# Patient Record
Sex: Male | Born: 1965 | Race: White | Hispanic: No | Marital: Single | Smoking: Current every day smoker
Health system: Southern US, Community
[De-identification: ages and names within clinical notes are randomized; demographics above are authoritative.]

## PROBLEM LIST (undated history)

## (undated) DIAGNOSIS — S81812A Laceration without foreign body, left lower leg, initial encounter: Secondary | ICD-10-CM

## (undated) DIAGNOSIS — W3400XA Accidental discharge from unspecified firearms or gun, initial encounter: Secondary | ICD-10-CM

## (undated) HISTORY — PX: OTHER SURGICAL HISTORY: SHX169

---

## 2001-12-16 ENCOUNTER — Emergency Department (HOSPITAL_COMMUNITY): Admission: EM | Admit: 2001-12-16 | Discharge: 2001-12-16 | Payer: Self-pay | Admitting: *Deleted

## 2004-01-02 ENCOUNTER — Emergency Department (HOSPITAL_COMMUNITY): Admission: EM | Admit: 2004-01-02 | Discharge: 2004-01-02 | Payer: Self-pay | Admitting: Emergency Medicine

## 2010-01-31 ENCOUNTER — Emergency Department (HOSPITAL_COMMUNITY): Admission: EM | Admit: 2010-01-31 | Discharge: 2010-01-31 | Payer: Self-pay | Admitting: Emergency Medicine

## 2012-09-15 ENCOUNTER — Emergency Department (HOSPITAL_COMMUNITY): Payer: Self-pay

## 2012-09-15 ENCOUNTER — Encounter (HOSPITAL_COMMUNITY): Payer: Self-pay | Admitting: *Deleted

## 2012-09-15 ENCOUNTER — Emergency Department (HOSPITAL_COMMUNITY)
Admission: EM | Admit: 2012-09-15 | Discharge: 2012-09-15 | Disposition: A | Discharge status: Home / Self Care | Payer: Self-pay | Attending: Emergency Medicine | Admitting: Emergency Medicine

## 2012-09-15 DIAGNOSIS — K8020 Calculus of gallbladder without cholecystitis without obstruction: Secondary | ICD-10-CM | POA: Insufficient documentation

## 2012-09-15 DIAGNOSIS — R109 Unspecified abdominal pain: Secondary | ICD-10-CM | POA: Insufficient documentation

## 2012-09-15 DIAGNOSIS — F17200 Nicotine dependence, unspecified, uncomplicated: Secondary | ICD-10-CM | POA: Insufficient documentation

## 2012-09-15 LAB — URINALYSIS, ROUTINE W REFLEX MICROSCOPIC
Leukocytes, UA: NEGATIVE
Protein, ur: NEGATIVE mg/dL
Urobilinogen, UA: 0.2 mg/dL (ref 0.0–1.0)

## 2012-09-15 LAB — BASIC METABOLIC PANEL
CO2: 27 mEq/L (ref 19–32)
Creatinine, Ser: 0.88 mg/dL (ref 0.50–1.35)
GFR calc non Af Amer: >90 mL/min (ref >90)
Potassium: 4.1 mEq/L (ref 3.5–5.1)
Sodium: 135 mEq/L (ref 135–145)

## 2012-09-15 LAB — CBC WITH DIFFERENTIAL/PLATELET
Eosinophils Relative: 2 % (ref 0–5)
HCT: 43.3 % (ref 39.0–52.0)
MCV: 93.1 fL (ref 78.0–100.0)
Monocytes Absolute: 0.9 10*3/uL (ref 0.1–1.0)
Monocytes Relative: 7 % (ref 3–12)
RBC: 4.65 MIL/uL (ref 4.22–5.81)
RDW: 13 % (ref 11.5–15.5)
WBC: 13 10*3/uL — ABNORMAL HIGH (ref 4.0–10.5)

## 2012-09-15 LAB — URINE MICROSCOPIC-ADD ON

## 2012-09-15 MED ORDER — HYDROMORPHONE HCL PF 1 MG/ML IJ SOLN
1.0000 mg | Freq: Once | INTRAMUSCULAR | Status: AC
  Administered 2012-09-15: 1 mg via INTRAVENOUS
  Filled 2012-09-15: qty 1

## 2012-09-15 MED ORDER — ONDANSETRON HCL 4 MG/2ML IJ SOLN
4.0000 mg | Freq: Once | INTRAMUSCULAR | Status: AC
  Administered 2012-09-15: 4 mg via INTRAVENOUS
  Filled 2012-09-15: qty 2

## 2012-09-15 MED ORDER — OXYCODONE-ACETAMINOPHEN 5-325 MG PO TABS: 2.0000 | ORAL_TABLET | ORAL | Status: DC | PRN

## 2012-09-15 MED ORDER — SODIUM CHLORIDE 0.9 % IV BOLUS (SEPSIS)
1000.0000 mL | Freq: Once | INTRAVENOUS | Status: AC
  Administered 2012-09-15: 1000 mL via INTRAVENOUS

## 2012-09-15 NOTE — ED Notes (Addendum)
Pt notes right sided flank pain that radiates to his abdomen. Denies history of kidney stones. Denies n/v/d. States no pain at this time since receiving pain medication. Pt is aware we are waiting on results of his CT scan
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
## 2012-09-15 NOTE — ED Notes (Signed)
Pt c/o rt flank pain with nausea all day today.

## 2012-09-15 NOTE — ED Notes (Signed)
Rt flank pain, nausea, no vomiting

## 2012-10-12 ENCOUNTER — Emergency Department (HOSPITAL_COMMUNITY): Payer: Self-pay

## 2012-10-12 ENCOUNTER — Inpatient Hospital Stay (HOSPITAL_COMMUNITY): Payer: Self-pay

## 2012-10-12 ENCOUNTER — Encounter (HOSPITAL_COMMUNITY): Payer: Self-pay | Admitting: Emergency Medicine

## 2012-10-12 ENCOUNTER — Inpatient Hospital Stay (HOSPITAL_COMMUNITY)
Admission: EM | Admit: 2012-10-12 | Discharge: 2012-10-15 | DRG: 416 | Disposition: A | Discharge status: Home / Self Care | Payer: Self-pay | Attending: General Surgery | Admitting: General Surgery

## 2012-10-12 DIAGNOSIS — K8000 Calculus of gallbladder with acute cholecystitis without obstruction: Principal | ICD-10-CM | POA: Diagnosis present

## 2012-10-12 DIAGNOSIS — F17200 Nicotine dependence, unspecified, uncomplicated: Secondary | ICD-10-CM | POA: Diagnosis present

## 2012-10-12 DIAGNOSIS — Z23 Encounter for immunization: Secondary | ICD-10-CM

## 2012-10-12 DIAGNOSIS — K819 Cholecystitis, unspecified: Secondary | ICD-10-CM

## 2012-10-12 LAB — CBC WITH DIFFERENTIAL/PLATELET
Lymphocytes Relative: 17 % (ref 12–46)
MCH: 32.2 pg (ref 26.0–34.0)
MCHC: 35.1 g/dL (ref 30.0–36.0)
Monocytes Absolute: 0.8 10*3/uL (ref 0.1–1.0)
Monocytes Relative: 7 % (ref 3–12)
Neutrophils Relative %: 75 % (ref 43–77)
Platelets: 248 10*3/uL (ref 150–400)

## 2012-10-12 LAB — SURGICAL PCR SCREEN
MRSA, PCR: NEGATIVE
Staphylococcus aureus: POSITIVE — AB

## 2012-10-12 LAB — URINE MICROSCOPIC-ADD ON

## 2012-10-12 LAB — COMPREHENSIVE METABOLIC PANEL
ALT: 16 U/L (ref 0–53)
AST: 15 U/L (ref 0–37)
Albumin: 3.9 g/dL (ref 3.5–5.2)
CO2: 26 mEq/L (ref 19–32)
Chloride: 103 mEq/L (ref 96–112)
GFR calc Af Amer: >90 mL/min (ref >90)
Sodium: 138 mEq/L (ref 135–145)

## 2012-10-12 LAB — LIPASE, BLOOD: Lipase: 47 U/L (ref 11–59)

## 2012-10-12 LAB — URINALYSIS, ROUTINE W REFLEX MICROSCOPIC
Bilirubin Urine: NEGATIVE
Glucose, UA: NEGATIVE mg/dL
Specific Gravity, Urine: >1.03 — ABNORMAL HIGH (ref 1.005–1.030)

## 2012-10-12 MED ORDER — SODIUM CHLORIDE 0.9 % IV SOLN
1000.0000 mL | Freq: Once | INTRAVENOUS | Status: AC
  Administered 2012-10-12: 1000 mL via INTRAVENOUS

## 2012-10-12 MED ORDER — HYDROMORPHONE HCL PF 1 MG/ML IJ SOLN
1.0000 mg | Freq: Once | INTRAMUSCULAR | Status: AC
  Administered 2012-10-12: 1 mg via INTRAVENOUS
  Filled 2012-10-12: qty 1

## 2012-10-12 MED ORDER — ONDANSETRON HCL 4 MG/2ML IJ SOLN: 4.0000 mg | Freq: Four times a day (QID) | INTRAMUSCULAR | Status: DC | PRN

## 2012-10-12 MED ORDER — INFLUENZA VIRUS VACC SPLIT PF IM SUSP
0.5000 mL | INTRAMUSCULAR | Status: AC
  Administered 2012-10-13: 0.5 mL via INTRAMUSCULAR
  Filled 2012-10-12: qty 0.5

## 2012-10-12 MED ORDER — SODIUM CHLORIDE 0.9 % IV SOLN
3.0000 g | Freq: Once | INTRAVENOUS | Status: AC
  Administered 2012-10-12: 3 g via INTRAVENOUS
  Filled 2012-10-12: qty 3

## 2012-10-12 MED ORDER — HYDROMORPHONE HCL PF 1 MG/ML IJ SOLN
1.0000 mg | INTRAMUSCULAR | Status: DC | PRN
  Administered 2012-10-12: 1 mg via INTRAVENOUS
  Filled 2012-10-12: qty 1

## 2012-10-12 MED ORDER — SODIUM CHLORIDE 0.9 % IV BOLUS (SEPSIS)
1000.0000 mL | Freq: Once | INTRAVENOUS | Status: AC
  Administered 2012-10-12: 1000 mL via INTRAVENOUS

## 2012-10-12 MED ORDER — GADOBENATE DIMEGLUMINE 529 MG/ML IV SOLN
15.0000 mL | Freq: Once | INTRAVENOUS | Status: AC | PRN
  Administered 2012-10-12: 15 mL via INTRAVENOUS

## 2012-10-12 MED ORDER — ENOXAPARIN SODIUM 40 MG/0.4ML ~~LOC~~ SOLN
40.0000 mg | Freq: Once | SUBCUTANEOUS | Status: AC
  Administered 2012-10-12: 40 mg via SUBCUTANEOUS
  Filled 2012-10-12: qty 0.4

## 2012-10-12 MED ORDER — ONDANSETRON HCL 4 MG/2ML IJ SOLN
4.0000 mg | Freq: Once | INTRAMUSCULAR | Status: AC
  Administered 2012-10-12: 4 mg via INTRAVENOUS
  Filled 2012-10-12: qty 2

## 2012-10-12 MED ORDER — CHLORHEXIDINE GLUCONATE CLOTH 2 % EX PADS
6.0000 | MEDICATED_PAD | Freq: Every day | CUTANEOUS | Status: DC
  Administered 2012-10-13 – 2012-10-14 (×2): 6 via TOPICAL

## 2012-10-12 MED ORDER — SODIUM CHLORIDE 0.9 % IV SOLN
INTRAVENOUS | Status: DC
  Administered 2012-10-12 – 2012-10-13 (×3): via INTRAVENOUS

## 2012-10-12 MED ORDER — CHLORHEXIDINE GLUCONATE 4 % EX LIQD
1.0000 "application " | Freq: Once | CUTANEOUS | Status: AC
  Administered 2012-10-12: 1 via TOPICAL
  Filled 2012-10-12: qty 15

## 2012-10-12 MED ORDER — MUPIROCIN 2 % EX OINT
1.0000 "application " | TOPICAL_OINTMENT | Freq: Two times a day (BID) | CUTANEOUS | Status: DC
  Administered 2012-10-12 – 2012-10-13 (×4): 1 via NASAL
  Filled 2012-10-12: qty 22

## 2012-10-12 MED ORDER — SODIUM CHLORIDE 0.9 % IV SOLN
1000.0000 mL | INTRAVENOUS | Status: DC
  Administered 2012-10-12: 1000 mL via INTRAVENOUS

## 2012-10-12 MED ORDER — PNEUMOCOCCAL VAC POLYVALENT 25 MCG/0.5ML IJ INJ
0.5000 mL | INJECTION | INTRAMUSCULAR | Status: AC
  Administered 2012-10-13: 0.5 mL via INTRAMUSCULAR
  Filled 2012-10-12: qty 0.5
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
## 2012-10-12 NOTE — ED Notes (Signed)
Patient would like something for a headache RN made aware. 
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
## 2012-10-12 NOTE — ED Notes (Signed)
Pt voiced understanding of being npo.
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
## 2012-10-12 NOTE — ED Notes (Signed)
Pt completely undressed except for hospital gown. Belongings were placed in belonging bags by the pt.

## 2012-10-12 NOTE — ED Notes (Signed)
States he has been having intermittent chest discomfort along with his right abdominal pain, states his chest discomfort has returned.

## 2012-10-12 NOTE — ED Notes (Signed)
Patient states that he feels like there is something wrong with him and worries about it frequently, asks questions regarding cancer of the stomach.

## 2012-10-12 NOTE — ED Notes (Signed)
Pt washed off with CHG wipes

## 2012-10-12 NOTE — ED Notes (Signed)
States he has been having abdominal pain off and on for about 1 year, states about 1 month ago he was diagnosed with gallstones and was to follow up with Dr. Lovell Sheehan in November and has not done so.

## 2012-10-13 MED ORDER — SODIUM CHLORIDE 0.9 % IV SOLN
1.5000 g | Freq: Four times a day (QID) | INTRAVENOUS | Status: DC
  Administered 2012-10-13 – 2012-10-14 (×4): 1.5 g via INTRAVENOUS
  Filled 2012-10-13 (×5): qty 1.5

## 2012-10-13 MED ORDER — CHLORHEXIDINE GLUCONATE 4 % EX LIQD
1.0000 "application " | Freq: Once | CUTANEOUS | Status: AC
  Administered 2012-10-13: 1 via TOPICAL
  Filled 2012-10-13: qty 15

## 2012-10-13 MED ORDER — SODIUM CHLORIDE 0.9 % IV SOLN
INTRAVENOUS | Status: DC
  Administered 2012-10-13: 20:00:00 via INTRAVENOUS

## 2012-10-13 NOTE — Care Management Note (Unsigned)
    Page 1 of 1   10/13/2012     1:35:19 PM   CARE MANAGEMENT NOTE 10/13/2012  Patient:  Jeffery Potter, Jeffery Potter   Account Number:  [id number]  Date Initiated:  10/13/2012  Documentation initiated by:  Sharrie Rothman  Subjective/Objective Assessment:   Pt admitted form home with acute cholecystitis. Pt lives with his brother and is indpendent.     Action/Plan:   Financial counselor awareof pts self pay status. No CM or HH needs noted.   Anticipated DC Date:  10/15/2012   Anticipated DC Plan:  HOME/SELF CARE  In-house referral  Financial Counselor      DC Planning Services  CM consult      Choice offered to / List presented to:             Status of service:  Completed, signed off Medicare Important Message given?   (If response is "NO", the following Medicare IM given date fields will be blank) Date Medicare IM given:   Date Additional Medicare IM given:    Discharge Disposition:    Per UR Regulation:    If discussed at Long Length of Stay Meetings, dates discussed:    Comments:  10/13/12 1330 Arlyss Queen, RN BSN CM

## 2012-10-13 NOTE — Progress Notes (Signed)
UR chart review completed.  

## 2012-10-13 NOTE — Progress Notes (Signed)
Subjective: Still has intermittent right upper quadrant abdominal pain, but much improved. He is tolerating full liquid diet well.  Objective: Vital signs in last 24 hours: Temp:  [97.4 F (36.3 C)-98.7 F (37.1 C)] 97.8 F (36.6 C) (12/05 0404) Pulse Rate:  [54-68] 61  (12/05 0404) Resp:  [16-18] 18  (12/05 0404) BP: (91-121)/(44-68) 106/57 mmHg (12/05 0621) SpO2:  [97 %-100 %] 98 % (12/05 0404) Weight:  [74.9 kg (165 lb 2 oz)] 74.9 kg (165 lb 2 oz) (12/04 1103) Last BM Date: 10/10/12  Intake/Output from previous day: 12/04 0701 - 12/05 0700 In: 2410 [P.O.:480; I.V.:1930] Out: 250 [Urine:250] Intake/Output this shift:    General appearance: alert, cooperative and no distress Resp: clear to auscultation bilaterally Cardio: regular rate and rhythm, S1, S2 normal, no murmur, click, rub or gallop GI: Soft, flat. Slightly tender in the right upper quadrant to palpation.  Lab Results:   [facility] 10/12/12 0259  WBC 10.7*  HGB 14.1  HCT 40.2  PLT 248   BMET  Basename 10/12/12 0259  NA 138  K 3.9  CL 103  CO2 26  GLUCOSE 105*  BUN 21  CREATININE 1.10  CALCIUM 9.3   PT/INR No results found for this basename: LABPROT:2,INR:2 in the last 72 hours  Studies/Results: Mr Abd W/wo Cm/mrcp  10/13/2012  *RADIOLOGY REPORT*  Clinical Data:  Right upper quadrant abdominal pain with nausea and vomiting.  Cholelithiasis and biliary dilatation on CT/ultrasound.  MRI ABDOMEN WITHOUT AND WITH CONTRAST (INCLUDING MRCP)  Technique:  Multiplanar multisequence MR imaging of the abdomen was performed both before and after the administration of intravenous contrast. Heavily T2-weighted images of the biliary and pancreatic ducts were obtained, and three-dimensional MRCP images were rendered by post processing.  Contrast: 15mL MULTIHANCE GADOBENATE DIMEGLUMINE 529 MG/ML IV SOLN  Comparison:  Abdominal CT 09/15/2012.  Ultrasound 10/12/2012.  Findings:  Study is mildly motion degraded,  especially on the T2 and postcontrast images.  As demonstrated on earlier ultrasound, there are multiple gallstones.  There is moderate gallbladder wall thickening with pericholecystic fluid. The common bile duct measures 9 mm in diameter.  There is no intrahepatic biliary dilatation.  There is no evidence of choledocholithiasis.  The pancreas appears normal.  There is no evidence of peripancreatic fluid collection or pancreas divisum.  No significant hepatic abnormalities are identified.  Mild heterogeneity on the arterial phase images is attributed to incidental vascular phenomena.  The spleen, adrenal glands and kidneys appear normal.  The abdominal bowel gas pattern appears normal.  IMPRESSION:  1.  Cholelithiasis with gallbladder wall thickening and pericholecystic fluid consistent with acute cholecystitis as demonstrated on earlier ultrasound. 2.  Mild biliary dilatation without evidence of choledocholithiasis. 3.  No evidence of pancreatitis or pancreas divisum.   Original Report Authenticated By: Carey Bullocks, M.D.    US Abdomen Limited Ruq  10/12/2012  *RADIOLOGY REPORT*  Clinical Data:  Gallstones.  Evaluate for cholecystitis.  LIMITED ABDOMINAL ULTRASOUND - RIGHT UPPER QUADRANT  Comparison:  CT abdomen pelvis 09/15/2012  Findings:  Gallbladder:  Multiple gallstones are present, with the largest gallstone measuring approximately 1.8 cm in diameter. A gallstone is noted at the level of the gallbladder neck, and the sonographer noted that it did not move with changes in patient positioning. The gallbladder wall is thickened and demonstrates hypoechoic bands of edema within it.  Wall thickness ranges in thickness from are approximately 7 mm to 12 mm. The sonographic Murphy's sign is positive.  Common bile duct:  Dilated, measuring up to 9.3 mm where visualized. No filling defects are seen within the visualized portion of the common bile duct.  Liver:  Normal in echogenicity.  No focal hepatic lesion.  No  definite intrahepatic biliary duct dilatation.  IMPRESSION:  1.  Acute cholecystitis. 2.  Common bile duct dilatation.  Measures up to 9.3 mm where visualized.  A distal common bile duct obstruction cannot be excluded; the duct is not seen in its entirety.                    Original Report Authenticated By: Britta Mccreedy, M.D.     Anti-infectives: Anti-infectives     Start     Dose/Rate Route Frequency Ordered Stop   10/12/12 0815   Ampicillin-Sulbactam (UNASYN) 3 g in sodium chloride 0.9 % 100 mL IVPB        3 g 100 mL/hr over 60 Minutes Intravenous  Once 10/12/12 0800 10/12/12 4098          Assessment/Plan: Impression: Acute cholecystitis with cholelithiasis. MRCP done yesterday evening reveals no choledocholithiasis. We'll continue IV Unasyn. Patient to undergo open cholecystectomy tomorrow. The risks and benefits of the procedure including bleeding, infection, and hepatobiliary injury were fully explained to the patient, who gave informed consent.  LOS: 1 day    Jeffery Potter A 10/13/2012

## 2012-10-14 ENCOUNTER — Encounter (HOSPITAL_COMMUNITY): Payer: Self-pay | Admitting: Anesthesiology

## 2012-10-14 ENCOUNTER — Encounter (HOSPITAL_COMMUNITY)
Admission: EM | Disposition: A | Discharge status: Home / Self Care | Payer: Self-pay | Source: Home / Self Care | Attending: General Surgery

## 2012-10-14 ENCOUNTER — Inpatient Hospital Stay (HOSPITAL_COMMUNITY): Payer: Self-pay | Admitting: Anesthesiology

## 2012-10-14 ENCOUNTER — Encounter (HOSPITAL_COMMUNITY): Payer: Self-pay | Admitting: *Deleted

## 2012-10-14 HISTORY — PX: CHOLECYSTECTOMY: SHX55

## 2012-10-14 SURGERY — CHOLECYSTECTOMY
Anesthesia: General | Site: Abdomen | Wound class: Contaminated

## 2012-10-14 MED ORDER — ENOXAPARIN SODIUM 40 MG/0.4ML ~~LOC~~ SOLN
40.0000 mg | SUBCUTANEOUS | Status: DC
  Filled 2012-10-14: qty 0.4

## 2012-10-14 MED ORDER — BUPIVACAINE HCL (PF) 0.5 % IJ SOLN
INTRAMUSCULAR | Status: DC | PRN
  Administered 2012-10-14: 10 mL

## 2012-10-14 MED ORDER — ONDANSETRON HCL 4 MG PO TABS
4.0000 mg | ORAL_TABLET | Freq: Four times a day (QID) | ORAL | Status: DC | PRN
  Filled 2012-10-14: qty 1

## 2012-10-14 MED ORDER — MIDAZOLAM HCL 2 MG/2ML IJ SOLN
INTRAMUSCULAR | Status: AC
  Filled 2012-10-14: qty 2

## 2012-10-14 MED ORDER — ACETAMINOPHEN 10 MG/ML IV SOLN
INTRAVENOUS | Status: AC
  Filled 2012-10-14: qty 100

## 2012-10-14 MED ORDER — HEMOSTATIC AGENTS (NO CHARGE) OPTIME
TOPICAL | Status: DC | PRN
  Administered 2012-10-14: 1 via TOPICAL

## 2012-10-14 MED ORDER — MIDAZOLAM HCL 2 MG/2ML IJ SOLN
1.0000 mg | INTRAMUSCULAR | Status: DC | PRN
  Administered 2012-10-14 (×2): 2 mg via INTRAVENOUS

## 2012-10-14 MED ORDER — FENTANYL CITRATE 0.05 MG/ML IJ SOLN
INTRAMUSCULAR | Status: AC
  Filled 2012-10-14: qty 5

## 2012-10-14 MED ORDER — PROPOFOL 10 MG/ML IV BOLUS
INTRAVENOUS | Status: DC | PRN
  Administered 2012-10-14: 170 mg via INTRAVENOUS

## 2012-10-14 MED ORDER — ONDANSETRON HCL 4 MG/2ML IJ SOLN: 4.0000 mg | Freq: Four times a day (QID) | INTRAMUSCULAR | Status: DC | PRN

## 2012-10-14 MED ORDER — GLYCOPYRROLATE 0.2 MG/ML IJ SOLN
INTRAMUSCULAR | Status: AC
  Filled 2012-10-14: qty 4

## 2012-10-14 MED ORDER — ONDANSETRON HCL 4 MG/2ML IJ SOLN
4.0000 mg | Freq: Once | INTRAMUSCULAR | Status: AC
  Administered 2012-10-14: 4 mg via INTRAVENOUS

## 2012-10-14 MED ORDER — SUCCINYLCHOLINE CHLORIDE 20 MG/ML IJ SOLN
INTRAMUSCULAR | Status: AC
  Filled 2012-10-14: qty 1

## 2012-10-14 MED ORDER — LACTATED RINGERS IV SOLN
INTRAVENOUS | Status: DC
  Administered 2012-10-14: 09:00:00 via INTRAVENOUS
  Administered 2012-10-14: 1000 mL via INTRAVENOUS

## 2012-10-14 MED ORDER — POVIDONE-IODINE 10 % EX OINT
TOPICAL_OINTMENT | CUTANEOUS | Status: AC
  Filled 2012-10-14: qty 2

## 2012-10-14 MED ORDER — GLYCOPYRROLATE 0.2 MG/ML IJ SOLN
INTRAMUSCULAR | Status: AC
  Filled 2012-10-14: qty 3

## 2012-10-14 MED ORDER — HYDROMORPHONE HCL PF 1 MG/ML IJ SOLN
1.0000 mg | INTRAMUSCULAR | Status: DC | PRN
  Administered 2012-10-14 (×2): 1 mg via INTRAVENOUS
  Filled 2012-10-14 (×2): qty 1

## 2012-10-14 MED ORDER — ONDANSETRON HCL 4 MG/2ML IJ SOLN: 4.0000 mg | Freq: Once | INTRAMUSCULAR | Status: DC | PRN

## 2012-10-14 MED ORDER — LACTATED RINGERS IV SOLN
INTRAVENOUS | Status: DC
  Administered 2012-10-14 – 2012-10-15 (×2): via INTRAVENOUS

## 2012-10-14 MED ORDER — HYDROMORPHONE HCL PF 1 MG/ML IJ SOLN
INTRAMUSCULAR | Status: AC
  Filled 2012-10-14: qty 1

## 2012-10-14 MED ORDER — GLYCOPYRROLATE 0.2 MG/ML IJ SOLN
INTRAMUSCULAR | Status: DC | PRN
  Administered 2012-10-14: .8 mg via INTRAVENOUS

## 2012-10-14 MED ORDER — PROPOFOL 10 MG/ML IV EMUL
INTRAVENOUS | Status: AC
  Filled 2012-10-14: qty 20

## 2012-10-14 MED ORDER — NEOSTIGMINE METHYLSULFATE 1 MG/ML IJ SOLN
INTRAMUSCULAR | Status: AC
  Filled 2012-10-14: qty 10

## 2012-10-14 MED ORDER — FENTANYL CITRATE 0.05 MG/ML IJ SOLN
INTRAMUSCULAR | Status: DC | PRN
  Administered 2012-10-14 (×11): 50 ug via INTRAVENOUS

## 2012-10-14 MED ORDER — ROCURONIUM BROMIDE 100 MG/10ML IV SOLN
INTRAVENOUS | Status: DC | PRN
  Administered 2012-10-14: 10 mg via INTRAVENOUS
  Administered 2012-10-14: 40 mg via INTRAVENOUS

## 2012-10-14 MED ORDER — LIDOCAINE HCL (CARDIAC) 20 MG/ML IV SOLN
INTRAVENOUS | Status: DC | PRN
  Administered 2012-10-14: 50 mg via INTRAVENOUS

## 2012-10-14 MED ORDER — ACETAMINOPHEN 10 MG/ML IV SOLN
1000.0000 mg | Freq: Four times a day (QID) | INTRAVENOUS | Status: AC
  Administered 2012-10-14 – 2012-10-15 (×4): 1000 mg via INTRAVENOUS
  Filled 2012-10-14 (×3): qty 100

## 2012-10-14 MED ORDER — HYDROMORPHONE HCL PF 1 MG/ML IJ SOLN
0.2500 mg | INTRAMUSCULAR | Status: DC | PRN
  Administered 2012-10-14 (×4): 0.5 mg via INTRAVENOUS

## 2012-10-14 MED ORDER — FENTANYL CITRATE 0.05 MG/ML IJ SOLN
25.0000 ug | INTRAMUSCULAR | Status: DC | PRN
  Administered 2012-10-14 (×4): 50 ug via INTRAVENOUS

## 2012-10-14 MED ORDER — ONDANSETRON HCL 4 MG/2ML IJ SOLN
INTRAMUSCULAR | Status: AC
  Filled 2012-10-14: qty 2

## 2012-10-14 MED ORDER — ROCURONIUM BROMIDE 50 MG/5ML IV SOLN
INTRAVENOUS | Status: AC
  Filled 2012-10-14: qty 1

## 2012-10-14 MED ORDER — 0.9 % SODIUM CHLORIDE (POUR BTL) OPTIME
TOPICAL | Status: DC | PRN
  Administered 2012-10-14: 2000 mL

## 2012-10-14 MED ORDER — BUPIVACAINE HCL (PF) 0.5 % IJ SOLN
INTRAMUSCULAR | Status: AC
  Filled 2012-10-14: qty 30

## 2012-10-14 MED ORDER — POVIDONE-IODINE 10 % OINT PACKET
TOPICAL_OINTMENT | CUTANEOUS | Status: DC | PRN
  Administered 2012-10-14: 1 via TOPICAL

## 2012-10-14 MED ORDER — NEOSTIGMINE METHYLSULFATE 1 MG/ML IJ SOLN
INTRAMUSCULAR | Status: DC | PRN
  Administered 2012-10-14: 4 mg via INTRAVENOUS

## 2012-10-14 MED ORDER — LIDOCAINE HCL (PF) 1 % IJ SOLN
INTRAMUSCULAR | Status: AC
  Filled 2012-10-14: qty 5

## 2012-10-14 SURGICAL SUPPLY — 38 items
APPLIER CLIP 11 MED OPEN (CLIP) ×3
APPLIER CLIP 13 LRG OPEN (CLIP) ×3
BAG HAMPER (MISCELLANEOUS) ×3 IMPLANT
CLEANER TIP ELECTROSURG 2X2 (MISCELLANEOUS) ×3 IMPLANT
CLIP APPLIE 11 MED OPEN (CLIP) ×2 IMPLANT
CLIP APPLIE 13 LRG OPEN (CLIP) ×2 IMPLANT
CLOTH BEACON ORANGE TIMEOUT ST (SAFETY) ×3 IMPLANT
COVER LIGHT HANDLE STERIS (MISCELLANEOUS) ×3 IMPLANT
DRAPE WARM FLUID 44X44 (DRAPE) ×3 IMPLANT
DURAPREP 26ML APPLICATOR (WOUND CARE) ×3 IMPLANT
ELECT BLADE 6 FLAT ULTRCLN (ELECTRODE) ×3 IMPLANT
ELECT REM PT RETURN 9FT ADLT (ELECTROSURGICAL) ×3
ELECTRODE REM PT RTRN 9FT ADLT (ELECTROSURGICAL) ×2 IMPLANT
FORMALIN 10 PREFIL 480ML (MISCELLANEOUS) ×3 IMPLANT
GLOVE BIO SURGEON STRL SZ7.5 (GLOVE) ×3 IMPLANT
GLOVE INDICATOR 7.0 STRL GRN (GLOVE) ×12 IMPLANT
GLOVE SS BIOGEL STRL SZ 6.5 (GLOVE) ×8 IMPLANT
GLOVE SUPERSENSE BIOGEL SZ 6.5 (GLOVE) ×4
GOWN STRL REIN XL XLG (GOWN DISPOSABLE) ×12 IMPLANT
HEMOSTAT SNOW SURGICEL 2X4 (HEMOSTASIS) ×3 IMPLANT
INST SET MAJOR GENERAL (KITS) ×3 IMPLANT
KIT ROOM TURNOVER APOR (KITS) ×3 IMPLANT
MANIFOLD NEPTUNE II (INSTRUMENTS) ×3 IMPLANT
NEEDLE HYPO 25X1 1.5 SAFETY (NEEDLE) ×3 IMPLANT
NS IRRIG 1000ML POUR BTL (IV SOLUTION) ×6 IMPLANT
PACK ABDOMINAL MAJOR (CUSTOM PROCEDURE TRAY) ×3 IMPLANT
PAD ARMBOARD 7.5X6 YLW CONV (MISCELLANEOUS) ×3 IMPLANT
SET BASIN LINEN APH (SET/KITS/TRAYS/PACK) ×3 IMPLANT
SPONGE GAUZE 4X4 12PLY (GAUZE/BANDAGES/DRESSINGS) ×3 IMPLANT
SPONGE INTESTINAL PEANUT (DISPOSABLE) ×6 IMPLANT
SPONGE LAP 4X18 X RAY DECT (DISPOSABLE) ×3 IMPLANT
STAPLER VISISTAT (STAPLE) ×6 IMPLANT
SUT SILK 2 0 (SUTURE) ×1
SUT SILK 2-0 18XBRD TIE 12 (SUTURE) ×2 IMPLANT
SUT VIC AB 0 CT1 27 (SUTURE) ×2
SUT VIC AB 0 CT1 27XCR 8 STRN (SUTURE) ×4 IMPLANT
SYR CONTROL 10ML LL (SYRINGE) ×3 IMPLANT
TAPE CLOTH SURG 4X10 WHT LF (GAUZE/BANDAGES/DRESSINGS) ×3 IMPLANT

## 2012-10-14 NOTE — Progress Notes (Signed)
Lap top, gray book light at bedside. Eyeglasses returned to pt.

## 2012-10-14 NOTE — Anesthesia Postprocedure Evaluation (Addendum)
  Anesthesia Post-op Note  Patient: Jeffery Potter  Procedure(s) Performed: Procedure(s) (LRB) with comments: CHOLECYSTECTOMY (N/A)  Patient Location: PACU  Anesthesia Type:General  Level of Consciousness: awake, alert  and oriented  Airway and Oxygen Therapy: Patient Spontanous Breathing and Patient connected to face mask oxygen  Post-op Pain: moderate  Post-op Assessment: Post-op Vital signs reviewed, Patient's Cardiovascular Status Stable, Respiratory Function Stable, Patent Airway and No signs of Nausea or vomiting  Post-op Vital Signs: Reviewed and stable  Complications: No apparent anesthesia complications 10/16/12  Patient discharged yesterday.

## 2012-10-14 NOTE — Anesthesia Procedure Notes (Signed)
Procedure Name: Intubation Date/Time: 10/14/2012 7:42 AM Performed by: Glynn Octave E Pre-anesthesia Checklist: Patient identified, Patient being monitored, Timeout performed, Emergency Drugs available and Suction available Patient Re-evaluated:Patient Re-evaluated prior to inductionOxygen Delivery Method: Circle System Utilized Preoxygenation: Pre-oxygenation with 100% oxygen Intubation Type: IV induction Ventilation: Mask ventilation without difficulty Laryngoscope Size: Mac and 3 Grade View: Grade I Tube type: Oral Tube size: 8.0 mm Number of attempts: 1 Airway Equipment and Method: stylet Placement Confirmation: ETT inserted through vocal cords under direct vision,  positive ETCO2 and breath sounds checked- equal and bilateral Secured at: 22 cm Tube secured with: Tape Dental Injury: Teeth and Oropharynx as per pre-operative assessment

## 2012-10-14 NOTE — Op Note (Signed)
Patient:  Jeffery Potter  DOB:  1966-09-18  MRN:  [id number]   Preop Diagnosis:  Acute cholecystitis, cholelithiasis  Postop Diagnosis:  Same  Procedure:  Open cholecystectomy  Surgeon:  Franky Macho, M.D.  Anes:  General endotracheal  Indications:  Patient is a 46 year old white male who presents with acute cholecystitis secondary to cholelithiasis. Preoperative MRCP was negative for choledocholithiasis. Due to multiple surgeries in the past, the patient now presents for an open cholecystectomy. Risks and benefits of the procedure including bleeding, infection, and the possibility of hepatobiliary injury were fully explained to the patient, who gave informed consent.  Procedure note:  The patient was placed in the supine position. After induction of general endotracheal anesthesia, the abdomen was prepped and draped using usual sterile technique with DuraPrep. Surgical site confirmation was performed.  A right upper quadrant subcostal incision was made down to the peritoneum. The peritoneal cavity was then into without difficulty. There was some adhesion of the transverse colon present and this was freed away sharply without difficulty. The gallbladder was identified. The liver was within normal limits. A retrograde dome down approach was then used to free the gallbladder from the liver using blunt dissection and cautery. The cystic duct and artery were then fully identified. There junction to the infundibulum flow identified. Clips were placed proximally distally on both structures, then divided. The gallbladder was removed from the operative field and sent to pathology further examination. A small amount of bile leakage was noted from the gallbladder. The right upper quadrant was copiously are irrigated with normal saline. The gallbladder bed was within normal limits. No bile leakage or bleeding was noted. Surgicel was placed in the gallbladder fossa. All fluid intakes were then removed from  the abdominal cavity.  The posterior rectus sheath was reapproximated using interrupted 0 Vicryl sutures. The anterior rectus sheath was reapproximated using 0 Vicryl interrupted sutures. The skin was injected with 0.5% Sensorcaine. The subcutaneous layer was irrigated normal saline and the skin was closed using staples.  All tape and needle counts were correct at the end of the procedure. The patient was extubated in the operating room and transferred to PACU in stable condition.  Complications:  None  EBL:  25 cc  Specimen:  Gallbladder

## 2012-10-14 NOTE — Anesthesia Preprocedure Evaluation (Addendum)
Anesthesia Evaluation  Patient identified by MRN, date of birth, ID band Patient awake    Reviewed: Allergy & Precautions, H&P , NPO status , Patient's Chart, lab work & pertinent test results  History of Anesthesia Complications Negative for: history of anesthetic complications  Airway Mallampati: I TM Distance: >3 FB     Dental  (+) Teeth Intact   Pulmonary neg pulmonary ROS, Current Smoker,  breath sounds clear to auscultation        Cardiovascular negative cardio ROS  Rhythm:Regular Rate:Normal     Neuro/Psych    GI/Hepatic negative GI ROS,   Endo/Other    Renal/GU      Musculoskeletal   Abdominal   Peds  Hematology   Anesthesia Other Findings   Reproductive/Obstetrics                           Anesthesia Physical Anesthesia Plan  ASA: II  Anesthesia Plan: General   Post-op Pain Management:    Induction: Intravenous  Airway Management Planned: Oral ETT  Additional Equipment:   Intra-op Plan:   Post-operative Plan: Extubation in OR  Informed Consent: I have reviewed the patients History and Physical, chart, labs and discussed the procedure including the risks, benefits and alternatives for the proposed anesthesia with the patient or authorized representative who has indicated his/her understanding and acceptance.     Plan Discussed with:   Anesthesia Plan Comments:         Anesthesia Quick Evaluation

## 2012-10-14 NOTE — Transfer of Care (Signed)
Immediate Anesthesia Transfer of Care Note  Patient: Jeffery Potter  Procedure(s) Performed: Procedure(s) (LRB) with comments: CHOLECYSTECTOMY (N/A)  Patient Location: PACU  Anesthesia Type:General  Level of Consciousness: awake and alert   Airway & Oxygen Therapy: Patient Spontanous Breathing and Patient connected to face mask oxygen  Post-op Assessment: Report given to PACU RN  Post vital signs: Reviewed and stable  Complications: No apparent anesthesia complications

## 2012-10-15 LAB — CBC
MCH: 31.7 pg (ref 26.0–34.0)
MCHC: 34.3 g/dL (ref 30.0–36.0)
Platelets: 221 10*3/uL (ref 150–400)
RDW: 12.5 % (ref 11.5–15.5)

## 2012-10-15 LAB — COMPREHENSIVE METABOLIC PANEL
ALT: 29 U/L (ref 0–53)
AST: 32 U/L (ref 0–37)
Albumin: 3.7 g/dL (ref 3.5–5.2)
Calcium: 9.1 mg/dL (ref 8.4–10.5)
GFR calc Af Amer: >90 mL/min (ref >90)
Sodium: 137 mEq/L (ref 135–145)
Total Protein: 6.3 g/dL (ref 6.0–8.3)

## 2012-10-15 LAB — MAGNESIUM: Magnesium: 1.8 mg/dL (ref 1.5–2.5)

## 2012-10-15 MED ORDER — OXYCODONE-ACETAMINOPHEN 5-325 MG PO TABS: 1.0000 | ORAL_TABLET | ORAL | Status: DC | PRN

## 2012-10-15 NOTE — Progress Notes (Signed)
Pt provided with discharge and follow-up instructions.  Pt provided with instructions on medication regimen at discharge.  Pt's IV removed.  Pt tolerated well.  Pt stable at time of discharge.

## 2012-10-15 NOTE — Discharge Summary (Signed)
Physician Discharge Summary  Patient ID: Jeffery Potter MRN: [id number] DOB/AGE: [birth year] 46 y.o.  Admit date: 10/12/2012 Discharge date: 10/15/2012  Admission Diagnoses: Acute cholecystitis, cholelithiasis  Discharge Diagnoses: Same Active Problems:  * No active hospital problems. *    Discharged Condition: good  Hospital Course: Patient is a 46 year old white male who presented to the emergency room with worsening right upper quadrant abdominal pain. Given known history of cholelithiasis and this was confirmed by ultrasound. He was also noted to have a dilated common bile duct at 9 mm. Preoperative MRCP was negative for choledocholithiasis. The patient subsequently underwent an open cholecystectomy on 10/14/2012. This was done due to multiple abdominal surgeries in the past. He tolerated the procedure well. His postoperative course has been unremarkable. His diet was advanced at difficulty. The patient is being discharged home in good improving condition.  Treatments: surgery: Open cholecystectomy on 10/14/2012  Discharge Exam: Blood pressure 112/73, pulse 59, temperature 98.6 F (37 C), temperature source Oral, resp. rate 20, height 5\' 10"  (1.778 m), weight 74.9 kg (165 lb 2 oz), SpO2 97.00%. General appearance: alert, cooperative and no distress Resp: clear to auscultation bilaterally Cardio: regular rate and rhythm, S1, S2 normal, no murmur, click, rub or gallop GI: Soft, flat. Incision healing well.  Disposition: 01-Home or Self Care     Medication List     As of 10/15/2012  8:57 AM    TAKE these medications         oxyCODONE-acetaminophen 5-325 MG per tablet   Commonly known as: PERCOCET/ROXICET   Take 1-2 tablets by mouth every 4 (four) hours as needed for pain.           Follow-up Information    Follow up with Dalia Heading, MD. Schedule an appointment as soon as possible for a visit on 10/25/2012.   Contact information:   [address] [place]  [place] [address] [phone]          Signed: Franky Macho A 10/15/2012, 8:57 AM

## 2012-10-16 NOTE — Addendum Note (Signed)
Addendum  created 10/16/12 1317 by Moshe Salisbury, CRNA   Modules edited:Notes Section

## 2012-10-18 ENCOUNTER — Encounter (HOSPITAL_COMMUNITY): Payer: Self-pay | Admitting: General Surgery
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
## 2013-12-03 ENCOUNTER — Encounter (HOSPITAL_COMMUNITY)
Admission: EM | Disposition: A | Discharge status: Home / Self Care | Payer: Self-pay | Source: Home / Self Care | Attending: Emergency Medicine

## 2013-12-03 ENCOUNTER — Encounter (HOSPITAL_COMMUNITY): Payer: Self-pay | Admitting: Emergency Medicine

## 2013-12-03 ENCOUNTER — Emergency Department (HOSPITAL_COMMUNITY): Payer: Self-pay

## 2013-12-03 ENCOUNTER — Observation Stay (HOSPITAL_COMMUNITY)
Admission: EM | Admit: 2013-12-03 | Discharge: 2013-12-04 | Disposition: A | Discharge status: Home / Self Care | Payer: Self-pay | Attending: Orthopedic Surgery | Admitting: Orthopedic Surgery

## 2013-12-03 ENCOUNTER — Encounter (HOSPITAL_COMMUNITY): Payer: Self-pay | Admitting: Anesthesiology

## 2013-12-03 ENCOUNTER — Emergency Department (HOSPITAL_COMMUNITY): Payer: Self-pay | Admitting: Anesthesiology

## 2013-12-03 DIAGNOSIS — F17200 Nicotine dependence, unspecified, uncomplicated: Secondary | ICD-10-CM | POA: Insufficient documentation

## 2013-12-03 DIAGNOSIS — H7292 Unspecified perforation of tympanic membrane, left ear: Secondary | ICD-10-CM

## 2013-12-03 DIAGNOSIS — S92919B Unspecified fracture of unspecified toe(s), initial encounter for open fracture: Secondary | ICD-10-CM | POA: Insufficient documentation

## 2013-12-03 DIAGNOSIS — S98139A Complete traumatic amputation of one unspecified lesser toe, initial encounter: Principal | ICD-10-CM | POA: Insufficient documentation

## 2013-12-03 DIAGNOSIS — W38XXXA Explosion and rupture of other specified pressurized devices, initial encounter: Secondary | ICD-10-CM | POA: Insufficient documentation

## 2013-12-03 DIAGNOSIS — S60511A Abrasion of right hand, initial encounter: Secondary | ICD-10-CM

## 2013-12-03 DIAGNOSIS — Z23 Encounter for immunization: Secondary | ICD-10-CM | POA: Insufficient documentation

## 2013-12-03 DIAGNOSIS — S81009A Unspecified open wound, unspecified knee, initial encounter: Secondary | ICD-10-CM | POA: Insufficient documentation

## 2013-12-03 DIAGNOSIS — S0081XA Abrasion of other part of head, initial encounter: Secondary | ICD-10-CM

## 2013-12-03 DIAGNOSIS — S0920XA Traumatic rupture of unspecified ear drum, initial encounter: Secondary | ICD-10-CM | POA: Insufficient documentation

## 2013-12-03 DIAGNOSIS — IMO0002 Reserved for concepts with insufficient information to code with codable children: Secondary | ICD-10-CM | POA: Insufficient documentation

## 2013-12-03 DIAGNOSIS — IMO0001 Reserved for inherently not codable concepts without codable children: Secondary | ICD-10-CM | POA: Diagnosis present

## 2013-12-03 DIAGNOSIS — S81812A Laceration without foreign body, left lower leg, initial encounter: Secondary | ICD-10-CM

## 2013-12-03 DIAGNOSIS — S61209A Unspecified open wound of unspecified finger without damage to nail, initial encounter: Secondary | ICD-10-CM | POA: Insufficient documentation

## 2013-12-03 DIAGNOSIS — S81809A Unspecified open wound, unspecified lower leg, initial encounter: Secondary | ICD-10-CM

## 2013-12-03 DIAGNOSIS — Y92009 Unspecified place in unspecified non-institutional (private) residence as the place of occurrence of the external cause: Secondary | ICD-10-CM | POA: Insufficient documentation

## 2013-12-03 DIAGNOSIS — S91009A Unspecified open wound, unspecified ankle, initial encounter: Secondary | ICD-10-CM

## 2013-12-03 DIAGNOSIS — S98142A Partial traumatic amputation of one left lesser toe, initial encounter: Secondary | ICD-10-CM

## 2013-12-03 HISTORY — PX: AMPUTATION: SHX166

## 2013-12-03 HISTORY — DX: Laceration without foreign body, left lower leg, initial encounter: S81.812A

## 2013-12-03 HISTORY — PX: I&D EXTREMITY: SHX5045

## 2013-12-03 HISTORY — PX: TOE AMPUTATION: SHX809

## 2013-12-03 SURGERY — IRRIGATION AND DEBRIDEMENT EXTREMITY
Anesthesia: General | Site: Toe | Laterality: Left

## 2013-12-03 MED ORDER — ONDANSETRON HCL 4 MG PO TABS: 4.0000 mg | ORAL_TABLET | Freq: Four times a day (QID) | ORAL | Status: DC | PRN

## 2013-12-03 MED ORDER — METOCLOPRAMIDE HCL 10 MG PO TABS: 5.0000 mg | ORAL_TABLET | Freq: Three times a day (TID) | ORAL | Status: DC | PRN

## 2013-12-03 MED ORDER — CEPHALEXIN 500 MG PO CAPS: 500.0000 mg | ORAL_CAPSULE | Freq: Four times a day (QID) | ORAL | Status: DC

## 2013-12-03 MED ORDER — METHOCARBAMOL 500 MG PO TABS: 500.0000 mg | ORAL_TABLET | Freq: Four times a day (QID) | ORAL | Status: DC | PRN

## 2013-12-03 MED ORDER — OXYCODONE HCL 5 MG PO TABS
5.0000 mg | ORAL_TABLET | Freq: Once | ORAL | Status: AC | PRN
  Administered 2013-12-03: 5 mg via ORAL

## 2013-12-03 MED ORDER — SODIUM CHLORIDE 0.9 % IR SOLN
Status: DC | PRN
  Administered 2013-12-03: 9000 mL

## 2013-12-03 MED ORDER — MIDAZOLAM HCL 5 MG/5ML IJ SOLN
INTRAMUSCULAR | Status: DC | PRN
  Administered 2013-12-03 (×2): 1 mg via INTRAVENOUS

## 2013-12-03 MED ORDER — LACTATED RINGERS IV SOLN
INTRAVENOUS | Status: DC | PRN
  Administered 2013-12-03 (×2): via INTRAVENOUS

## 2013-12-03 MED ORDER — CEFAZOLIN SODIUM 1-5 GM-% IV SOLN
1.0000 g | Freq: Once | INTRAVENOUS | Status: AC
  Administered 2013-12-03: 1 g via INTRAVENOUS
  Filled 2013-12-03: qty 50

## 2013-12-03 MED ORDER — HYDROMORPHONE HCL PF 1 MG/ML IJ SOLN
1.0000 mg | Freq: Once | INTRAMUSCULAR | Status: AC
  Administered 2013-12-03: 1 mg via INTRAVENOUS
  Filled 2013-12-03: qty 1

## 2013-12-03 MED ORDER — SODIUM CHLORIDE 0.9 % IV SOLN: INTRAVENOUS | Status: DC

## 2013-12-03 MED ORDER — SENNA-DOCUSATE SODIUM 8.6-50 MG PO TABS: 2.0000 | ORAL_TABLET | Freq: Every day | ORAL | Status: DC

## 2013-12-03 MED ORDER — OXYCODONE HCL 5 MG PO TABS: 5.0000 mg | ORAL_TABLET | ORAL | Status: DC | PRN

## 2013-12-03 MED ORDER — PROMETHAZINE HCL 25 MG/ML IJ SOLN: 6.2500 mg | INTRAMUSCULAR | Status: DC | PRN

## 2013-12-03 MED ORDER — CEFAZOLIN SODIUM-DEXTROSE 2-3 GM-% IV SOLR
2.0000 g | Freq: Four times a day (QID) | INTRAVENOUS | Status: AC
  Administered 2013-12-03 – 2013-12-04 (×3): 2 g via INTRAVENOUS
  Filled 2013-12-03 (×3): qty 50

## 2013-12-03 MED ORDER — PROPOFOL 10 MG/ML IV BOLUS
INTRAVENOUS | Status: AC
  Filled 2013-12-03: qty 20

## 2013-12-03 MED ORDER — LIDOCAINE HCL (CARDIAC) 20 MG/ML IV SOLN
INTRAVENOUS | Status: DC | PRN
  Administered 2013-12-03: 100 mg via INTRAVENOUS

## 2013-12-03 MED ORDER — ZOLPIDEM TARTRATE 5 MG PO TABS: 5.0000 mg | ORAL_TABLET | Freq: Every evening | ORAL | Status: DC | PRN

## 2013-12-03 MED ORDER — ONDANSETRON HCL 4 MG/2ML IJ SOLN
INTRAMUSCULAR | Status: DC | PRN
  Administered 2013-12-03: 4 mg via INTRAVENOUS

## 2013-12-03 MED ORDER — HYDROMORPHONE HCL PF 1 MG/ML IJ SOLN
0.2500 mg | INTRAMUSCULAR | Status: DC | PRN
  Administered 2013-12-03 (×4): 0.5 mg via INTRAVENOUS

## 2013-12-03 MED ORDER — ONDANSETRON HCL 4 MG/2ML IJ SOLN
INTRAMUSCULAR | Status: AC
  Filled 2013-12-03: qty 2

## 2013-12-03 MED ORDER — OXYCODONE-ACETAMINOPHEN 5-325 MG PO TABS: 1.0000 | ORAL_TABLET | Freq: Four times a day (QID) | ORAL | Status: DC | PRN

## 2013-12-03 MED ORDER — METHOCARBAMOL 100 MG/ML IJ SOLN
500.0000 mg | Freq: Four times a day (QID) | INTRAVENOUS | Status: DC | PRN
  Filled 2013-12-03: qty 5

## 2013-12-03 MED ORDER — SENNA 8.6 MG PO TABS
1.0000 | ORAL_TABLET | Freq: Two times a day (BID) | ORAL | Status: DC
  Administered 2013-12-03 – 2013-12-04 (×2): 8.6 mg via ORAL
  Filled 2013-12-03 (×3): qty 1

## 2013-12-03 MED ORDER — SUCCINYLCHOLINE CHLORIDE 20 MG/ML IJ SOLN
INTRAMUSCULAR | Status: AC
  Filled 2013-12-03: qty 1

## 2013-12-03 MED ORDER — HYDROMORPHONE HCL PF 1 MG/ML IJ SOLN
INTRAMUSCULAR | Status: AC
  Filled 2013-12-03: qty 1

## 2013-12-03 MED ORDER — OXYCODONE HCL 5 MG PO TABS
ORAL_TABLET | ORAL | Status: AC
  Filled 2013-12-03: qty 1

## 2013-12-03 MED ORDER — BUPIVACAINE HCL (PF) 0.25 % IJ SOLN
INTRAMUSCULAR | Status: DC | PRN
  Administered 2013-12-03: 30 mL

## 2013-12-03 MED ORDER — PROMETHAZINE HCL 25 MG PO TABS: 25.0000 mg | ORAL_TABLET | Freq: Four times a day (QID) | ORAL | Status: DC | PRN

## 2013-12-03 MED ORDER — FENTANYL CITRATE 0.05 MG/ML IJ SOLN
INTRAMUSCULAR | Status: AC
  Filled 2013-12-03: qty 5

## 2013-12-03 MED ORDER — ONDANSETRON HCL 4 MG/2ML IJ SOLN: 4.0000 mg | Freq: Four times a day (QID) | INTRAMUSCULAR | Status: DC | PRN

## 2013-12-03 MED ORDER — DOCUSATE SODIUM 100 MG PO CAPS
100.0000 mg | ORAL_CAPSULE | Freq: Two times a day (BID) | ORAL | Status: DC
  Administered 2013-12-03 – 2013-12-04 (×2): 100 mg via ORAL
  Filled 2013-12-03 (×3): qty 1

## 2013-12-03 MED ORDER — FENTANYL CITRATE 0.05 MG/ML IJ SOLN
INTRAMUSCULAR | Status: DC | PRN
  Administered 2013-12-03 (×2): 75 ug via INTRAVENOUS
  Administered 2013-12-03: 100 ug via INTRAVENOUS

## 2013-12-03 MED ORDER — PROPOFOL 10 MG/ML IV BOLUS
INTRAVENOUS | Status: DC | PRN
  Administered 2013-12-03: 200 mg via INTRAVENOUS

## 2013-12-03 MED ORDER — DEXTROSE 5 % IV SOLN
2.0000 g | INTRAVENOUS | Status: AC
  Administered 2013-12-03: 2 g via INTRAVENOUS
  Filled 2013-12-03: qty 20

## 2013-12-03 MED ORDER — LIDOCAINE HCL (CARDIAC) 20 MG/ML IV SOLN
INTRAVENOUS | Status: AC
  Filled 2013-12-03: qty 5

## 2013-12-03 MED ORDER — METOCLOPRAMIDE HCL 5 MG/ML IJ SOLN: 5.0000 mg | Freq: Three times a day (TID) | INTRAMUSCULAR | Status: DC | PRN

## 2013-12-03 MED ORDER — SUCCINYLCHOLINE CHLORIDE 20 MG/ML IJ SOLN
INTRAMUSCULAR | Status: DC | PRN
  Administered 2013-12-03: 100 mg via INTRAVENOUS

## 2013-12-03 MED ORDER — HYDROMORPHONE HCL PF 1 MG/ML IJ SOLN
0.5000 mg | INTRAMUSCULAR | Status: DC | PRN
  Administered 2013-12-03: 1 mg via INTRAVENOUS
  Filled 2013-12-03: qty 1

## 2013-12-03 MED ORDER — OXYCODONE HCL 5 MG/5ML PO SOLN: 5.0000 mg | Freq: Once | ORAL | Status: AC | PRN

## 2013-12-03 MED ORDER — OXYCODONE-ACETAMINOPHEN 5-325 MG PO TABS
1.0000 | ORAL_TABLET | ORAL | Status: DC | PRN
  Administered 2013-12-04 (×3): 2 via ORAL
  Filled 2013-12-03 (×3): qty 2

## 2013-12-03 MED ORDER — MIDAZOLAM HCL 2 MG/2ML IJ SOLN
INTRAMUSCULAR | Status: AC
  Filled 2013-12-03: qty 2

## 2013-12-03 SURGICAL SUPPLY — 52 items
BANDAGE ELASTIC 4 VELCRO ST LF (GAUZE/BANDAGES/DRESSINGS) ×4 IMPLANT
BANDAGE ELASTIC 6 VELCRO ST LF (GAUZE/BANDAGES/DRESSINGS) ×4 IMPLANT
BANDAGE GAUZE ELAST BULKY 4 IN (GAUZE/BANDAGES/DRESSINGS) IMPLANT
BNDG COHESIVE 4X5 TAN STRL (GAUZE/BANDAGES/DRESSINGS) ×4 IMPLANT
BNDG GAUZE ELAST 4 BULKY (GAUZE/BANDAGES/DRESSINGS) ×4 IMPLANT
BOOTCOVER CLEANROOM LRG (PROTECTIVE WEAR) ×8 IMPLANT
CLOTH BEACON ORANGE TIMEOUT ST (SAFETY) IMPLANT
COVER SURGICAL LIGHT HANDLE (MISCELLANEOUS) ×4 IMPLANT
CUFF TOURNIQUET SINGLE 34IN LL (TOURNIQUET CUFF) IMPLANT
DRAPE ORTHO SPLIT 77X108 STRL (DRAPES) ×4
DRAPE SURG ORHT 6 SPLT 77X108 (DRAPES) ×4 IMPLANT
DRSG PAD ABDOMINAL 8X10 ST (GAUZE/BANDAGES/DRESSINGS) ×8 IMPLANT
DURAPREP 26ML APPLICATOR (WOUND CARE) IMPLANT
ELECT REM PT RETURN 9FT ADLT (ELECTROSURGICAL)
ELECTRODE REM PT RTRN 9FT ADLT (ELECTROSURGICAL) IMPLANT
EVACUATOR 1/8 PVC DRAIN (DRAIN) IMPLANT
FILTER STRAW FLUID ASPIR (MISCELLANEOUS) ×4 IMPLANT
GAUZE XEROFORM 1X8 LF (GAUZE/BANDAGES/DRESSINGS) IMPLANT
GAUZE XEROFORM 5X9 LF (GAUZE/BANDAGES/DRESSINGS) ×4 IMPLANT
GLOVE BIOGEL PI ORTHO PRO SZ8 (GLOVE) ×2
GLOVE ORTHO TXT STRL SZ7.5 (GLOVE) ×4 IMPLANT
GLOVE PI ORTHO PRO STRL SZ8 (GLOVE) ×2 IMPLANT
GLOVE SURG ORTHO 8.0 STRL STRW (GLOVE) ×8 IMPLANT
GOWN STRL NON-REIN LRG LVL3 (GOWN DISPOSABLE) ×12 IMPLANT
HANDPIECE INTERPULSE COAX TIP (DISPOSABLE) ×2
KIT BASIN OR (CUSTOM PROCEDURE TRAY) ×4 IMPLANT
KIT ROOM TURNOVER OR (KITS) ×4 IMPLANT
MANIFOLD NEPTUNE II (INSTRUMENTS) ×4 IMPLANT
NEEDLE HYPO 25GX1X1/2 BEV (NEEDLE) ×4 IMPLANT
NS IRRIG 1000ML POUR BTL (IV SOLUTION) ×4 IMPLANT
PACK ORTHO EXTREMITY (CUSTOM PROCEDURE TRAY) ×4 IMPLANT
PAD ARMBOARD 7.5X6 YLW CONV (MISCELLANEOUS) ×8 IMPLANT
PADDING CAST ABS 4INX4YD NS (CAST SUPPLIES) ×2
PADDING CAST ABS COTTON 4X4 ST (CAST SUPPLIES) ×2 IMPLANT
SET HNDPC FAN SPRY TIP SCT (DISPOSABLE) ×2 IMPLANT
SPONGE GAUZE 4X4 12PLY (GAUZE/BANDAGES/DRESSINGS) ×8 IMPLANT
SPONGE LAP 18X18 X RAY DECT (DISPOSABLE) ×8 IMPLANT
STOCKINETTE IMPERVIOUS 9X36 MD (GAUZE/BANDAGES/DRESSINGS) ×4 IMPLANT
SUT ETHILON 3 0 PS 1 (SUTURE) ×8 IMPLANT
SUT ETHILON 4 0 PS 2 18 (SUTURE) ×8 IMPLANT
SUT VIC AB 0 CT1 27 (SUTURE) ×2
SUT VIC AB 0 CT1 27XBRD ANBCTR (SUTURE) ×2 IMPLANT
SUT VICRYL RAPIDE 4/0 PS 2 (SUTURE) ×4 IMPLANT
SYR CONTROL 10ML LL (SYRINGE) ×4 IMPLANT
TOWEL OR 17X24 6PK STRL BLUE (TOWEL DISPOSABLE) ×4 IMPLANT
TOWEL OR 17X26 10 PK STRL BLUE (TOWEL DISPOSABLE) ×8 IMPLANT
TUBE ANAEROBIC SPECIMEN COL (MISCELLANEOUS) IMPLANT
TUBE CONNECTING 12'X1/4 (SUCTIONS) ×1
TUBE CONNECTING 12X1/4 (SUCTIONS) ×3 IMPLANT
UNDERPAD 30X30 INCONTINENT (UNDERPADS AND DIAPERS) ×8 IMPLANT
WATER STERILE IRR 1000ML POUR (IV SOLUTION) ×4 IMPLANT
YANKAUER SUCT BULB TIP NO VENT (SUCTIONS) ×4 IMPLANT

## 2013-12-03 NOTE — Discharge Instructions (Signed)
Diet: As you were doing prior to hospitalization, keep her left foot elevated as much as possible.  Shower:  May shower but keep the wounds dry, use an occlusive plastic wrap, NO SOAKING IN TUB.  If the bandage gets wet, change with a clean dry gauze.  Dressing:  You may change your dressing 3-5 days after surgery if necessary, however if the wounds are dry, and then you can leave in place until you're seen in the office.  Then change the dressing daily with sterile gauze dressing.     Activity:  Increase activity slowly as tolerated, but follow the weight bearing instructions below.    Weight Bearing:   Use the postop shoe to help protect her toe. Okay to put weight on the leg if tolerated.  To prevent constipation: you may use a stool softener such as -  Colace (over the counter) 100 mg by mouth twice a day  Drink plenty of fluids (prune juice may be helpful) and high fiber foods Miralax (over the counter) for constipation as needed.    Itching:  If you experience itching with your medications, try taking only a single pain pill, or even half a pain pill at a time.  You may take up to 10 pain pills per day, and you can also use benadryl over the counter for itching or also to help with sleep.   Precautions:  If you experience chest pain or shortness of breath - call 911 immediately for transfer to the hospital emergency department!!  If you develop a fever greater that 101 F, purulent drainage from wound, increased redness or drainage from wound, or calf pain -- Call the office at [phone]                                                Follow- Up Appointment:  Please call for an appointment to be seen in 2 weeks [place] - [phone]

## 2013-12-03 NOTE — Transfer of Care (Signed)
Immediate Anesthesia Transfer of Care Note  Patient: Jeffery Potter  Procedure(s) Performed: Procedure(s): IRRIGATION AND DEBRIDEMENT of right  2,3,4, and5 distal digits and closure of lacerations on right hand. IRRIGATION AND DEBRIDEMENT OF LEFT LOWER  LEG AND WOUND CLOSURE.  (Left) iRRIGATION AND DEBRIDEMENT AND AMPUTATION OF LEFT FIFTH TOE (Left)  Patient Location: PACU  Anesthesia Type:General  Level of Consciousness: awake, alert  and oriented  Airway & Oxygen Therapy: Patient Spontanous Breathing and Patient connected to nasal cannula oxygen  Post-op Assessment: Report given to PACU RN, Post -op Vital signs reviewed and stable and Patient moving all extremities X 4  Post vital signs: Reviewed and stable  Complications: No apparent anesthesia complications
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
## 2013-12-03 NOTE — ED Notes (Signed)
Short stay room 37. Last ate and drank at 7am this morning.
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
## 2013-12-03 NOTE — ED Notes (Addendum)
Underwear, jeans, and thermal pants thrown in trash per pt request.

## 2013-12-03 NOTE — Anesthesia Preprocedure Evaluation (Addendum)
Anesthesia Evaluation  Patient identified by MRN, date of birth, ID band Patient awake    Reviewed: Allergy & Precautions, H&P , NPO status , Patient's Chart, lab work & pertinent test results  Airway Mallampati: I TM Distance: >3 FB Neck ROM: Full    Dental  (+) Dental Advisory Given and Edentulous Lower   Pulmonary COPDCurrent Smoker,  + rhonchi         Cardiovascular Rhythm:Regular Rate:Normal     Neuro/Psych    GI/Hepatic   Endo/Other    Renal/GU      Musculoskeletal   Abdominal   Peds  Hematology   Anesthesia Other Findings   Reproductive/Obstetrics                         Anesthesia Physical Anesthesia Plan  ASA: II and emergent  Anesthesia Plan: General   Post-op Pain Management:    Induction: Intravenous  Airway Management Planned: Oral ETT  Additional Equipment:   Intra-op Plan:   Post-operative Plan: Extubation in OR  Informed Consent: I have reviewed the patients History and Physical, chart, labs and discussed the procedure including the risks, benefits and alternatives for the proposed anesthesia with the patient or authorized representative who has indicated his/her understanding and acceptance.     Plan Discussed with: CRNA and Surgeon  Anesthesia Plan Comments:         Anesthesia Quick Evaluation
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
## 2013-12-03 NOTE — Anesthesia Procedure Notes (Signed)
Procedure Name: Intubation Date/Time: 12/03/2013 5:33 PM Performed by: Alanda AmassFRIEDMAN, Shuree Brossart A Pre-anesthesia Checklist: Patient identified, Timeout performed, Emergency Drugs available, Suction available and Patient being monitored Patient Re-evaluated:Patient Re-evaluated prior to inductionOxygen Delivery Method: Circle system utilized Preoxygenation: Pre-oxygenation with 100% oxygen Intubation Type: IV induction Laryngoscope Size: Mac and 3 Grade View: Grade I Tube size: 7.5 mm Number of attempts: 1 Airway Equipment and Method: Stylet Placement Confirmation: ETT inserted through vocal cords under direct vision,  breath sounds checked- equal and bilateral and positive ETCO2 Secured at: 22 cm Tube secured with: Tape Dental Injury: Teeth and Oropharynx as per pre-operative assessment
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
## 2013-12-03 NOTE — ED Notes (Signed)
Pt reports now he is starting to feel lightheaded, nauseous and the pain is increasing.

## 2013-12-03 NOTE — Anesthesia Postprocedure Evaluation (Signed)
  Anesthesia Post-op Note  Patient: Olga Millersdward N Valenza  Procedure(s) Performed: Procedure(s) with comments: IRRIGATION AND DEBRIDEMENT of right  2,3,4, and5 distal digits and closure of lacerations on right hand. IRRIGATION AND DEBRIDEMENT OF LEFT LOWER  LEG AND WOUND CLOSURE.  (Left) - Operative sites are left lower leg and right hand IRRIGATION AND DEBRIDEMENT AND AMPUTATION OF LEFT FIFTH TOE (Left)  Patient Location: PACU  Anesthesia Type:General  Level of Consciousness: awake and alert   Airway and Oxygen Therapy: Patient Spontanous Breathing  Post-op Pain: mild  Post-op Assessment: Post-op Vital signs reviewed, Patient's Cardiovascular Status Stable, Respiratory Function Stable, Patent Airway, No signs of Nausea or vomiting and Pain level controlled  Post-op Vital Signs: Reviewed and stable  Complications: No apparent anesthesia complications

## 2013-12-03 NOTE — Progress Notes (Signed)
Orthopedic Tech Progress Note Patient Details:  Olga Millersdward N Shave 03/26/1966 [id number]  Ortho Devices Type of Ortho Device: Postop shoe/boot Ortho Device/Splint Location: LLE Ortho Device/Splint Interventions: Ordered;Application   Jennye MoccasinHughes, Martavis Gurney Craig 12/03/2013, 7:54 PM

## 2013-12-04 ENCOUNTER — Encounter (HOSPITAL_COMMUNITY): Payer: Self-pay | Admitting: General Practice

## 2013-12-04 MED ORDER — INFLUENZA VAC SPLIT QUAD 0.5 ML IM SUSP: 0.5000 mL | INTRAMUSCULAR | Status: DC

## 2013-12-04 MED ORDER — INFLUENZA VAC SPLIT QUAD 0.5 ML IM SUSP
0.5000 mL | INTRAMUSCULAR | Status: AC
  Administered 2013-12-04: 0.5 mL via INTRAMUSCULAR
  Filled 2013-12-04: qty 0.5

## 2013-12-04 NOTE — Progress Notes (Signed)
Orthopedic Tech Progress Note Patient Details:  Jeffery Potter 05/10/1966 [id number] Crutches delivered for d/c Ortho Devices Type of Ortho Device: Crutches Ortho Device/Splint Location: LLE Ortho Device/Splint Interventions: Ordered   [name] 12/04/2013, 12:51 PM
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
## 2013-12-04 NOTE — Progress Notes (Signed)
Patient d/c to home, IV removed. Prescriptions given and instructions reviewed.

## 2013-12-05 ENCOUNTER — Encounter (HOSPITAL_COMMUNITY): Payer: Self-pay | Admitting: Orthopedic Surgery

## 2014-06-24 IMAGING — CT CT ABD-PELV W/O CM
3 of 4 series · 8 of 46 positions shown, 15 images · non-contrast
Comparison: None.

CLINICAL DATA: Right flank pain.  Nausea.

CT ABDOMEN AND PELVIS WITHOUT CONTRAST
TECHNIQUE: Multidetector CT imaging of the abdomen and pelvis was
performed following the standard protocol without intravenous
contrast.

[Series 3: lung 5.0 b60f · axial · 0.71mm/px · z∈[-146,-76]mm · 4 of 24 slices shown, 9 images]
[im 5/24  soft-tissue]
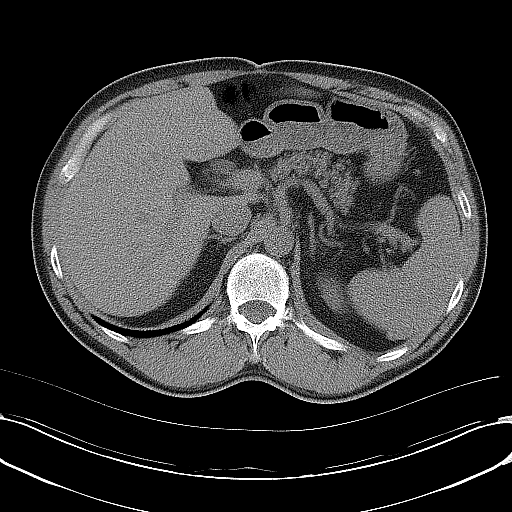
[im 5/24  lung]
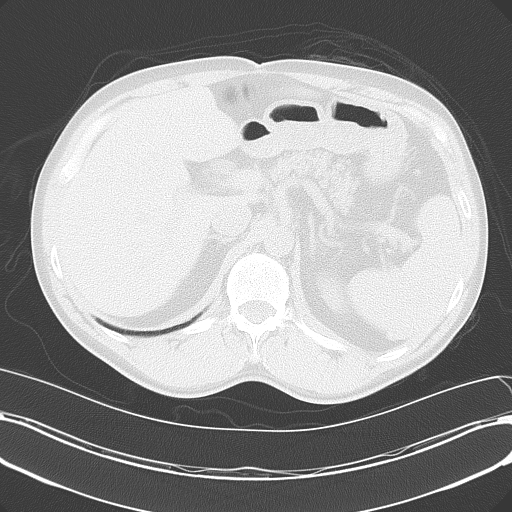
[im 5/24  bone]
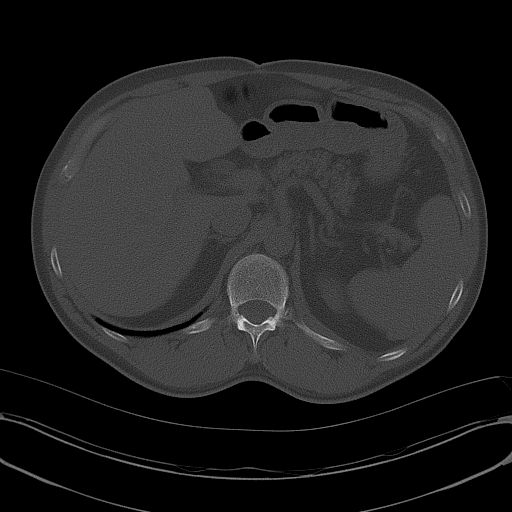
[im 10/24  soft-tissue]
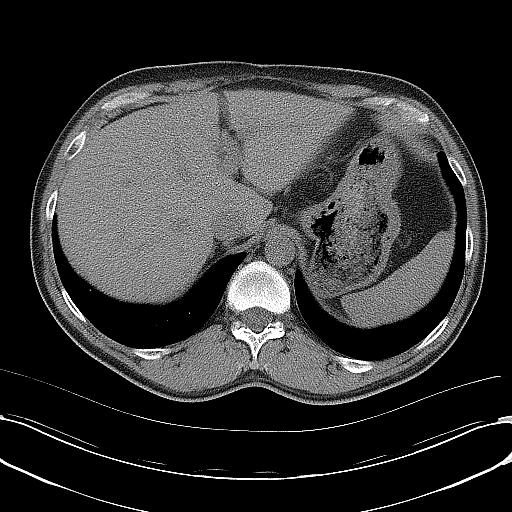
[im 10/24  lung]
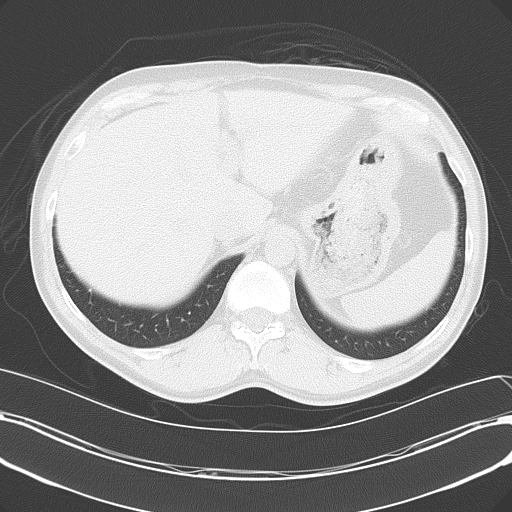
[im 14/24  soft-tissue]
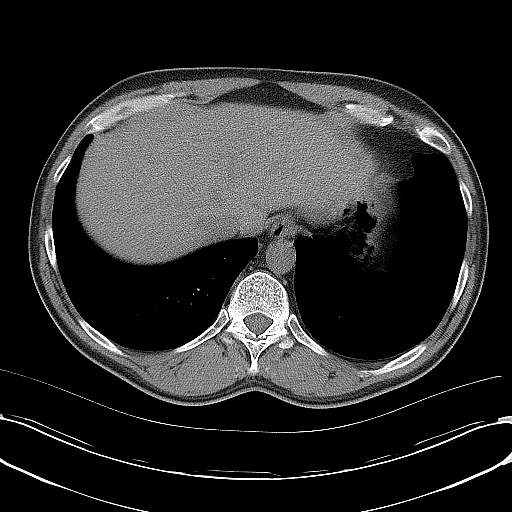
[im 14/24  lung]
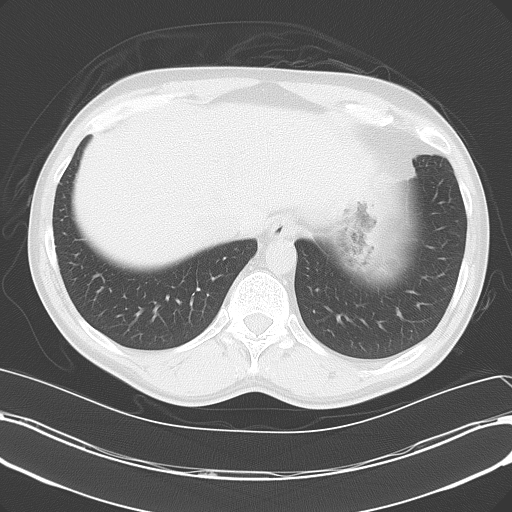
[im 19/24  soft-tissue]
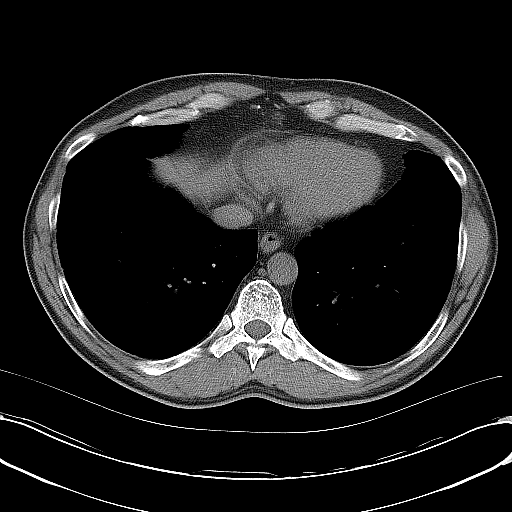
[im 19/24  lung]
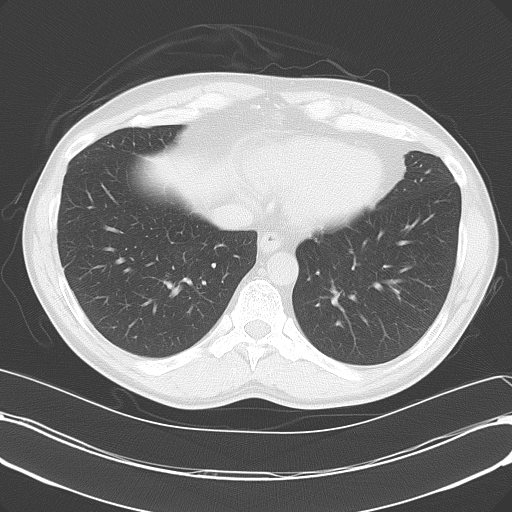

[Series 4: mpr coronal (id) · coronal · 0.68mm/px · 3 of 88 slices shown, 4 images]
[im 30/88  soft-tissue]
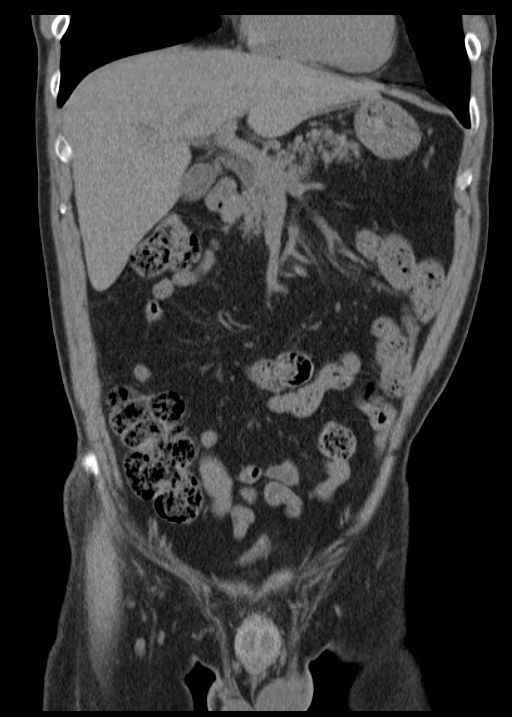
[im 39/88  soft-tissue]
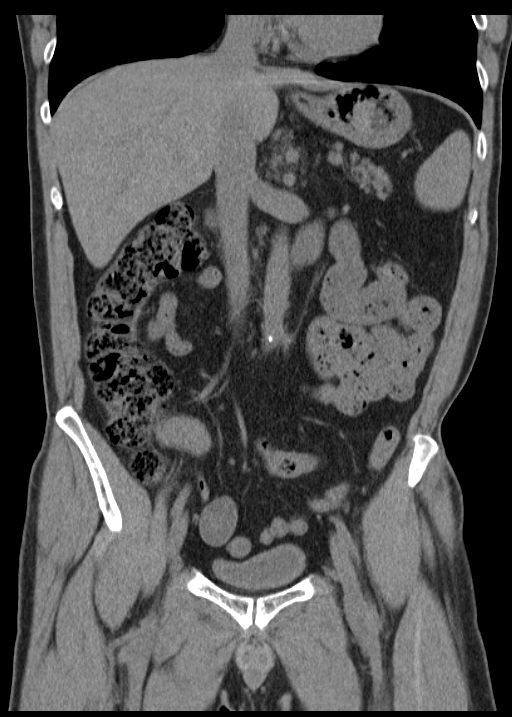
[im 39/88  bone]
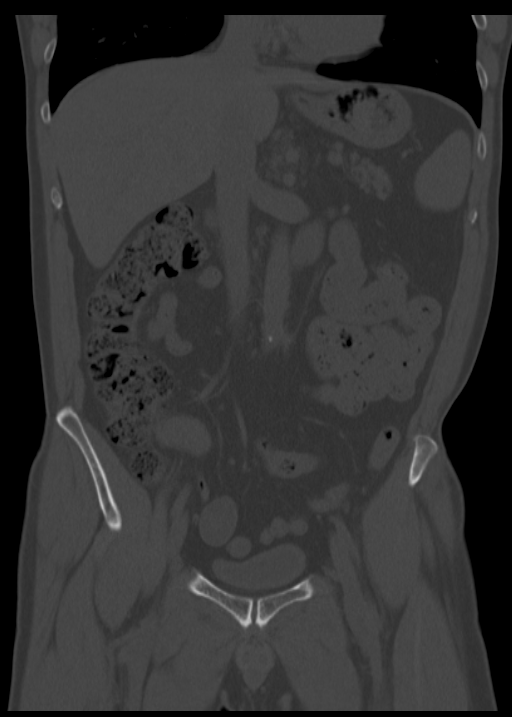
[im 49/88  soft-tissue]
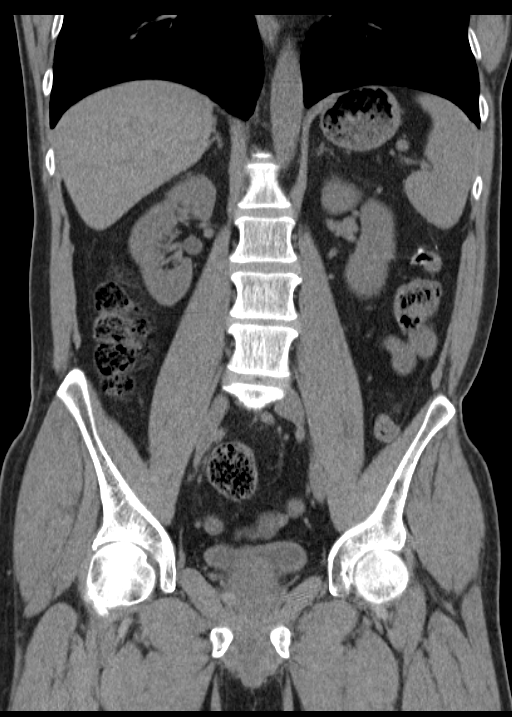

[Series 5: mpr sagittal (id) · sagittal · 0.52mm/px · 1 of 116 slices shown, 2 images]
[im 39/116  soft-tissue]
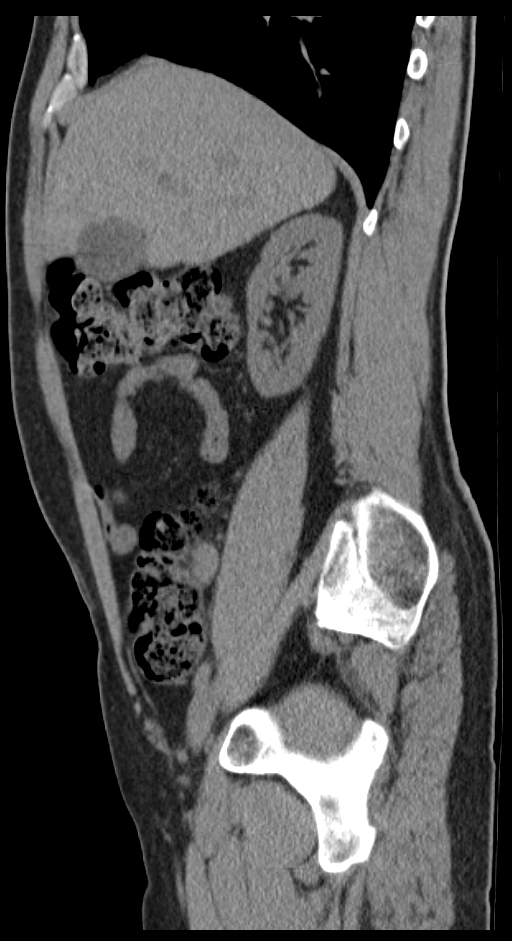
[im 39/116  bone]
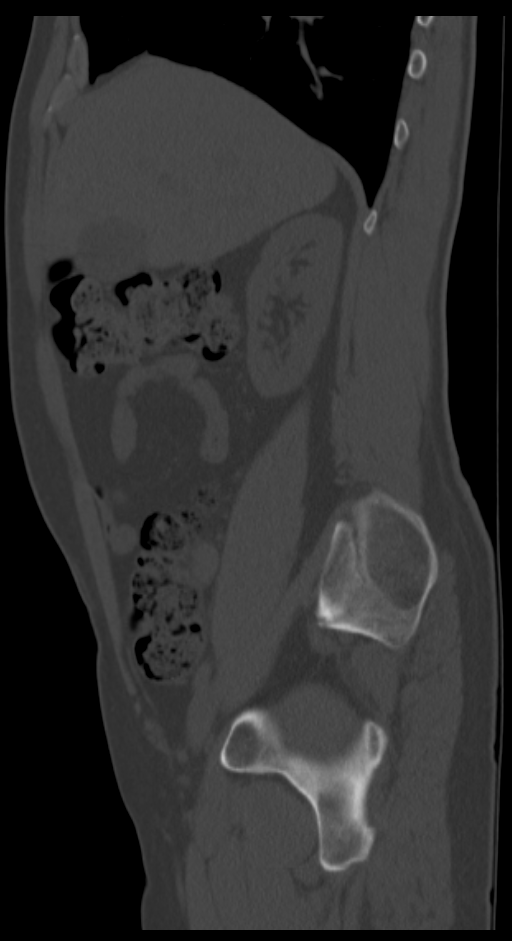

[8 of 46 positions shown; findings below may reference images not displayed]

FINDINGS: There are two cholesterol stones in the gallbladder.
Gallbladder is not distended.  No dilated bile ducts.

Liver, spleen, pancreas, adrenal glands, and kidneys are normal.
There are no renal or ureteral calculi.  No hydronephrosis.  The
bowel is normal including the terminal ileum and appendix.  No free
air or free fluid.  No mass lesions or adenopathy.  No acute
osseous abnormality.  Degenerative disc disease throughout the
lower lumbar spine.
IMPRESSION: No acute abnormalities.  Specifically, no evidence of renal or
ureteral calculi.

Cholesterol gallstones.

## 2015-09-11 ENCOUNTER — Encounter (HOSPITAL_COMMUNITY): Payer: Self-pay | Admitting: Emergency Medicine

## 2015-09-11 ENCOUNTER — Emergency Department (HOSPITAL_COMMUNITY)
Admission: EM | Admit: 2015-09-11 | Discharge: 2015-09-12 | Disposition: A | Discharge status: Home / Self Care | Payer: Self-pay | Attending: Emergency Medicine | Admitting: Emergency Medicine

## 2015-09-11 DIAGNOSIS — Z720 Tobacco use: Secondary | ICD-10-CM | POA: Insufficient documentation

## 2015-09-11 DIAGNOSIS — Z87828 Personal history of other (healed) physical injury and trauma: Secondary | ICD-10-CM | POA: Insufficient documentation

## 2015-09-11 DIAGNOSIS — R55 Syncope and collapse: Secondary | ICD-10-CM | POA: Insufficient documentation

## 2015-09-11 LAB — BASIC METABOLIC PANEL
Anion gap: 9 (ref 5–15)
BUN: 13 mg/dL (ref 6–20)
CO2: 23 mmol/L (ref 22–32)
Calcium: 9.4 mg/dL (ref 8.9–10.3)
Chloride: 103 mmol/L (ref 101–111)
Creatinine, Ser: 0.83 mg/dL (ref 0.61–1.24)
GFR calc Af Amer: >60 mL/min (ref >60)
GFR calc non Af Amer: >60 mL/min (ref >60)
Glucose, Bld: 96 mg/dL (ref 65–99)
Potassium: 4.4 mmol/L (ref 3.5–5.1)
Sodium: 135 mmol/L (ref 135–145)

## 2015-09-11 LAB — CBC
HCT: 43.6 % (ref 39.0–52.0)
Hemoglobin: 15.6 g/dL (ref 13.0–17.0)
MCH: 34.9 pg — ABNORMAL HIGH (ref 26.0–34.0)
MCHC: 35.8 g/dL (ref 30.0–36.0)
MCV: 97.5 fL (ref 78.0–100.0)
Platelets: 366 10*3/uL (ref 150–400)
RBC: 4.47 MIL/uL (ref 4.22–5.81)
RDW: 14.5 % (ref 11.5–15.5)
WBC: 16.6 10*3/uL — ABNORMAL HIGH (ref 4.0–10.5)

## 2015-09-11 LAB — CBG MONITORING, ED: Glucose-Capillary: 94 mg/dL (ref 65–99)

## 2015-09-11 MED ORDER — SODIUM CHLORIDE 0.9 % IV BOLUS (SEPSIS)
1000.0000 mL | Freq: Once | INTRAVENOUS | Status: AC
  Administered 2015-09-11: 1000 mL via INTRAVENOUS

## 2015-09-11 NOTE — ED Notes (Signed)
Notified RN, Verlon AuLeslie pt. CBG 94.

## 2015-09-11 NOTE — ED Notes (Signed)
Bed: NF62WA10 Expected date:  Expected time:  Means of arrival:  Comments: Syncope/tachy

## 2015-09-11 NOTE — ED Notes (Signed)
EKG given to EDP,Kohut,MD., for review. 

## 2015-09-11 NOTE — ED Notes (Signed)
Per EMS, patient is from home.  Per patient, he almost passed out.  Patient denies LOC, hitting his head, or dizzy spells.  Denies n/v/ or diarrhea.  Patient denies any medical history.  BP: 170/120 P:50-140 per EMS R: 16  CBG: 92

## 2015-09-11 NOTE — ED Notes (Signed)
Pt.made aware for the need of urine. 

## 2015-09-12 LAB — URINALYSIS, ROUTINE W REFLEX MICROSCOPIC
Bilirubin Urine: NEGATIVE
Glucose, UA: NEGATIVE mg/dL
Ketones, ur: NEGATIVE mg/dL
Leukocytes, UA: NEGATIVE
Nitrite: NEGATIVE
Protein, ur: NEGATIVE mg/dL
Specific Gravity, Urine: 1.015 (ref 1.005–1.030)
Urobilinogen, UA: 0.2 mg/dL (ref 0.0–1.0)
pH: 5.5 (ref 5.0–8.0)

## 2015-09-12 LAB — URINE MICROSCOPIC-ADD ON
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
## 2015-09-12 NOTE — Discharge Instructions (Signed)
Near-Syncope Near-syncope (commonly known as near fainting) is sudden weakness, dizziness, or feeling like you might pass out. This can happen when getting up or while standing for a long time. It is caused by a sudden decrease in blood flow to the brain, which can occur for various reasons. Most of the reasons are not serious.  HOME CARE Watch your condition for any changes.  Have someone stay with you until you feel stable.  If you feel like you are going to pass out:  Lie down right away.  Prop your feet up if you can.  Breathe deeply and steadily.  Move only when the feeling has gone away. Most of the time, this feeling lasts only a few minutes. You may feel tired for several hours.  Drink enough fluids to keep your pee (urine) clear or pale yellow.  If you are taking blood pressure or heart medicine, stand up slowly.  Follow up with your doctor as told. GET HELP RIGHT AWAY IF:   You have a severe headache.  You have unusual pain in the chest, belly (abdomen), or back.  You have bleeding from the mouth or butt (rectum), or you have black or tarry poop (stool).  You feel your heart beat differently than normal, or you have a very fast pulse.  You pass out, or you twitch and shake when you pass out.  You pass out when sitting or lying down.  You feel confused.  You have trouble walking.  You are weak.  You have vision problems. MAKE SURE YOU:   Understand these instructions.  Will watch your condition.  Will get help right away if you are not doing well or get worse.   This information is not intended to replace advice given to you by your health care provider. Make sure you discuss any questions you have with your health care provider.   Document Released: 04/13/2008 Document Revised: 11/16/2014 Document Reviewed: 03/31/2013 Elsevier Interactive Patient Education 2016 [employer]  

## 2016-11-15 ENCOUNTER — Emergency Department (HOSPITAL_COMMUNITY): Payer: Self-pay

## 2016-11-15 ENCOUNTER — Emergency Department (HOSPITAL_COMMUNITY)
Admission: EM | Admit: 2016-11-15 | Discharge: 2016-11-15 | Disposition: A | Discharge status: Home / Self Care | Payer: Self-pay | Attending: Emergency Medicine | Admitting: Emergency Medicine

## 2016-11-15 ENCOUNTER — Encounter (HOSPITAL_COMMUNITY): Payer: Self-pay

## 2016-11-15 DIAGNOSIS — R0602 Shortness of breath: Secondary | ICD-10-CM | POA: Insufficient documentation

## 2016-11-15 DIAGNOSIS — R002 Palpitations: Secondary | ICD-10-CM | POA: Insufficient documentation

## 2016-11-15 DIAGNOSIS — Z7982 Long term (current) use of aspirin: Secondary | ICD-10-CM | POA: Insufficient documentation

## 2016-11-15 DIAGNOSIS — F17210 Nicotine dependence, cigarettes, uncomplicated: Secondary | ICD-10-CM | POA: Insufficient documentation

## 2016-11-15 DIAGNOSIS — Z720 Tobacco use: Secondary | ICD-10-CM

## 2016-11-15 DIAGNOSIS — Z791 Long term (current) use of non-steroidal anti-inflammatories (NSAID): Secondary | ICD-10-CM | POA: Insufficient documentation

## 2016-11-15 DIAGNOSIS — R0789 Other chest pain: Secondary | ICD-10-CM | POA: Insufficient documentation

## 2016-11-15 LAB — COMPREHENSIVE METABOLIC PANEL
ALT: 11 U/L — AB (ref 17–63)
AST: 14 U/L — AB (ref 15–41)
Albumin: 4.1 g/dL (ref 3.5–5.0)
Alkaline Phosphatase: 85 U/L (ref 38–126)
Anion gap: 6 (ref 5–15)
BILIRUBIN TOTAL: 0.5 mg/dL (ref 0.3–1.2)
BUN: 13 mg/dL (ref 6–20)
CO2: 26 mmol/L (ref 22–32)
CREATININE: 1.01 mg/dL (ref 0.61–1.24)
Calcium: 9 mg/dL (ref 8.9–10.3)
Chloride: 104 mmol/L (ref 101–111)
Glucose, Bld: 114 mg/dL — ABNORMAL HIGH (ref 65–99)
Potassium: 3.9 mmol/L (ref 3.5–5.1)
Sodium: 136 mmol/L (ref 135–145)
TOTAL PROTEIN: 6.9 g/dL (ref 6.5–8.1)

## 2016-11-15 LAB — CBC
HEMATOCRIT: 41.3 % (ref 39.0–52.0)
Hemoglobin: 14.2 g/dL (ref 13.0–17.0)
MCH: 32.2 pg (ref 26.0–34.0)
MCHC: 34.4 g/dL (ref 30.0–36.0)
MCV: 93.7 fL (ref 78.0–100.0)
Platelets: 284 10*3/uL (ref 150–400)
RBC: 4.41 MIL/uL (ref 4.22–5.81)
RDW: 12.9 % (ref 11.5–15.5)
WBC: 10.3 10*3/uL (ref 4.0–10.5)

## 2016-11-15 LAB — TROPONIN I: Troponin I: <0.03 ng/mL (ref <0.03)

## 2016-11-15 MED ORDER — ACETAMINOPHEN 500 MG PO TABS
1000.0000 mg | ORAL_TABLET | Freq: Once | ORAL | Status: AC
  Administered 2016-11-15: 1000 mg via ORAL
  Filled 2016-11-15: qty 2

## 2016-11-15 NOTE — ED Triage Notes (Signed)
Pt presents to the ed with complaints of chest pain that started at 0600, it has been off and on, he had a severe sharp pain at 1600 and called ems, he has had 4 baby aspirin and 1 nitro and his pain is now a 1/10, he was having some shoulder problems this morning and originally thought that was it, at 1600 he said this was different and only in his chest

## 2016-11-15 NOTE — Discharge Instructions (Signed)
Stop smoking
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
## 2016-11-15 NOTE — ED Notes (Addendum)
Sister called and left her phone number, Jerolyn Shinammy Saari [phone]. Pt states he does not want her to know a lot but if his twin brother calls we can tell him anything

## 2017-10-17 ENCOUNTER — Encounter (HOSPITAL_COMMUNITY): Payer: Self-pay | Admitting: Cardiology

## 2017-10-17 DIAGNOSIS — Z5321 Procedure and treatment not carried out due to patient leaving prior to being seen by health care provider: Secondary | ICD-10-CM | POA: Insufficient documentation

## 2017-10-17 DIAGNOSIS — R509 Fever, unspecified: Secondary | ICD-10-CM | POA: Insufficient documentation

## 2018-05-04 ENCOUNTER — Emergency Department (HOSPITAL_COMMUNITY): Payer: Self-pay

## 2018-05-04 ENCOUNTER — Emergency Department (HOSPITAL_COMMUNITY)
Admission: EM | Admit: 2018-05-04 | Discharge: 2018-05-04 | Disposition: A | Discharge status: Home / Self Care | Payer: Self-pay | Attending: Emergency Medicine | Admitting: Emergency Medicine

## 2018-05-04 ENCOUNTER — Encounter (HOSPITAL_COMMUNITY): Payer: Self-pay

## 2018-05-04 ENCOUNTER — Other Ambulatory Visit: Payer: Self-pay

## 2018-05-04 DIAGNOSIS — S62522B Displaced fracture of distal phalanx of left thumb, initial encounter for open fracture: Secondary | ICD-10-CM

## 2018-05-04 DIAGNOSIS — S62525B Nondisplaced fracture of distal phalanx of left thumb, initial encounter for open fracture: Secondary | ICD-10-CM | POA: Insufficient documentation

## 2018-05-04 DIAGNOSIS — W312XXA Contact with powered woodworking and forming machines, initial encounter: Secondary | ICD-10-CM | POA: Insufficient documentation

## 2018-05-04 DIAGNOSIS — Y939 Activity, unspecified: Secondary | ICD-10-CM | POA: Insufficient documentation

## 2018-05-04 DIAGNOSIS — S61012A Laceration without foreign body of left thumb without damage to nail, initial encounter: Secondary | ICD-10-CM

## 2018-05-04 DIAGNOSIS — Y999 Unspecified external cause status: Secondary | ICD-10-CM | POA: Insufficient documentation

## 2018-05-04 DIAGNOSIS — Y929 Unspecified place or not applicable: Secondary | ICD-10-CM | POA: Insufficient documentation

## 2018-05-04 DIAGNOSIS — Z79899 Other long term (current) drug therapy: Secondary | ICD-10-CM | POA: Insufficient documentation

## 2018-05-04 DIAGNOSIS — Z23 Encounter for immunization: Secondary | ICD-10-CM | POA: Insufficient documentation

## 2018-05-04 DIAGNOSIS — F17210 Nicotine dependence, cigarettes, uncomplicated: Secondary | ICD-10-CM | POA: Insufficient documentation

## 2018-05-04 MED ORDER — LIDOCAINE HCL (PF) 2 % IJ SOLN
INTRAMUSCULAR | Status: AC
  Administered 2018-05-04: 5 mL
  Filled 2018-05-04: qty 20

## 2018-05-04 MED ORDER — TRAMADOL HCL 50 MG PO TABS
50.0000 mg | ORAL_TABLET | Freq: Once | ORAL | Status: AC
  Administered 2018-05-04: 50 mg via ORAL
  Filled 2018-05-04: qty 1

## 2018-05-04 MED ORDER — LIDOCAINE HCL (PF) 2 % IJ SOLN
10.0000 mL | Freq: Once | INTRAMUSCULAR | Status: AC
  Administered 2018-05-04: 5 mL

## 2018-05-04 MED ORDER — TETANUS-DIPHTH-ACELL PERTUSSIS 5-2.5-18.5 LF-MCG/0.5 IM SUSP
0.5000 mL | Freq: Once | INTRAMUSCULAR | Status: AC
  Administered 2018-05-04: 0.5 mL via INTRAMUSCULAR
  Filled 2018-05-04: qty 0.5

## 2018-05-04 MED ORDER — TRAMADOL HCL 50 MG PO TABS: 50.0000 mg | ORAL_TABLET | Freq: Four times a day (QID) | ORAL | 0 refills | Status: AC | PRN

## 2018-05-04 MED ORDER — CEPHALEXIN 500 MG PO CAPS: 500.0000 mg | ORAL_CAPSULE | Freq: Three times a day (TID) | ORAL | 0 refills | Status: AC
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
## 2018-05-04 NOTE — ED Triage Notes (Signed)
Pt reports cut left thumb on a table saw this morning.  Bleeding controlled.

## 2018-05-04 NOTE — Discharge Instructions (Signed)
You were seen in the ED today with injuries to the finger and thumb. Changes the dressings once daily. Take the antibiotics as instructed to prevent infection. Call the hand/ortho surgeon listed to schedule a follow up appointment. Limit bending or heavy listing until cleared to return to activity by the orthopedic surgeon. Return to the ED in 7 days for suture removal. The wound had very jagged edges and some tissue was missing so I expect some scarring to occur.   Return to the ED sooner with any worsening pain, redness, or thick drainage from the wound.

## 2018-11-07 ENCOUNTER — Encounter (HOSPITAL_COMMUNITY): Payer: Self-pay | Admitting: Emergency Medicine

## 2018-11-07 ENCOUNTER — Emergency Department (HOSPITAL_COMMUNITY): Payer: Self-pay

## 2018-11-07 ENCOUNTER — Emergency Department (HOSPITAL_COMMUNITY)
Admission: EM | Admit: 2018-11-07 | Discharge: 2018-11-07 | Disposition: A | Discharge status: Home / Self Care | Payer: Self-pay | Attending: Emergency Medicine | Admitting: Emergency Medicine

## 2018-11-07 ENCOUNTER — Other Ambulatory Visit: Payer: Self-pay

## 2018-11-07 DIAGNOSIS — R0789 Other chest pain: Secondary | ICD-10-CM | POA: Insufficient documentation

## 2018-11-07 DIAGNOSIS — F17210 Nicotine dependence, cigarettes, uncomplicated: Secondary | ICD-10-CM | POA: Insufficient documentation

## 2018-11-07 DIAGNOSIS — Z9049 Acquired absence of other specified parts of digestive tract: Secondary | ICD-10-CM | POA: Insufficient documentation

## 2018-11-07 HISTORY — DX: Accidental discharge from unspecified firearms or gun, initial encounter: W34.00XA

## 2018-11-07 LAB — BASIC METABOLIC PANEL
Anion gap: 7 (ref 5–15)
BUN: 23 mg/dL — ABNORMAL HIGH (ref 6–20)
CHLORIDE: 103 mmol/L (ref 98–111)
CO2: 26 mmol/L (ref 22–32)
Calcium: 9.3 mg/dL (ref 8.9–10.3)
Creatinine, Ser: 0.94 mg/dL (ref 0.61–1.24)
GFR calc Af Amer: >60 mL/min (ref >60)
GFR calc non Af Amer: >60 mL/min (ref >60)
Glucose, Bld: 110 mg/dL — ABNORMAL HIGH (ref 70–99)
POTASSIUM: 3.7 mmol/L (ref 3.5–5.1)
Sodium: 136 mmol/L (ref 135–145)

## 2018-11-07 LAB — CBC
HEMATOCRIT: 48.8 % (ref 39.0–52.0)
HEMOGLOBIN: 15.9 g/dL (ref 13.0–17.0)
MCH: 30.5 pg (ref 26.0–34.0)
MCHC: 32.6 g/dL (ref 30.0–36.0)
MCV: 93.5 fL (ref 80.0–100.0)
NRBC: 0 % (ref 0.0–0.2)
Platelets: 311 10*3/uL (ref 150–400)
RBC: 5.22 MIL/uL (ref 4.22–5.81)
RDW: 12.8 % (ref 11.5–15.5)
WBC: 10.4 10*3/uL (ref 4.0–10.5)

## 2018-11-07 LAB — I-STAT TROPONIN, ED
TROPONIN I, POC: 0 ng/mL (ref 0.00–0.08)
Troponin i, poc: 0 ng/mL (ref 0.00–0.08)

## 2018-11-07 MED ORDER — NITROGLYCERIN 2 % TD OINT
1.0000 [in_us] | TOPICAL_OINTMENT | Freq: Once | TRANSDERMAL | Status: AC
  Administered 2018-11-07: 1 [in_us] via TOPICAL
  Filled 2018-11-07: qty 1

## 2018-11-07 MED ORDER — ASPIRIN 81 MG PO CHEW
324.0000 mg | CHEWABLE_TABLET | Freq: Once | ORAL | Status: AC
  Administered 2018-11-07: 324 mg via ORAL
  Filled 2018-11-07: qty 4

## 2018-11-07 NOTE — ED Triage Notes (Signed)
PT c/o intermittent chest tightness/pain that radiates to his left shoulder that started yesterday.
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
## 2018-11-07 NOTE — ED Notes (Signed)
PA at bedside.

## 2018-11-07 NOTE — Discharge Instructions (Addendum)
Take an 81 mg aspirin daily. Follow-up with cardiology, referral given. Establish care with a primary care provider. Return to the emergency room for new or worsening symptoms.

## 2018-11-08 ENCOUNTER — Telehealth: Payer: Self-pay | Admitting: Internal Medicine

## 2018-11-08 NOTE — Telephone Encounter (Signed)
Patient needs appt in cardiology with provider or APP

## 2020-02-27 ENCOUNTER — Ambulatory Visit: Payer: Self-pay | Attending: Internal Medicine

## 2020-02-27 ENCOUNTER — Other Ambulatory Visit: Payer: Self-pay

## 2020-02-27 DIAGNOSIS — Z20822 Contact with and (suspected) exposure to covid-19: Secondary | ICD-10-CM | POA: Insufficient documentation

## 2020-02-28 LAB — SARS-COV-2, NAA 2 DAY TAT

## 2020-02-28 LAB — NOVEL CORONAVIRUS, NAA: SARS-CoV-2, NAA: NOT DETECTED
